# Patient Record
Sex: Male | Born: 1975 | Race: White | Hispanic: No | Smoking: Current every day smoker
Health system: Southern US, Community
[De-identification: ages and names within clinical notes are randomized; demographics above are authoritative.]

## PROBLEM LIST (undated history)

## (undated) DIAGNOSIS — K219 Gastro-esophageal reflux disease without esophagitis: Secondary | ICD-10-CM

## (undated) DIAGNOSIS — F209 Schizophrenia, unspecified: Secondary | ICD-10-CM

---

## 2011-12-12 LAB — CBC WITH DIFFERENTIAL/PLATELET
Basophil %: 0.5 %
Eosinophil %: 1.7 %
HCT: 47.1 % (ref 40.0–52.0)
HGB: 15.6 g/dL (ref 13.0–18.0)
Lymphocyte #: 1.6 10*3/uL (ref 1.0–3.6)
Lymphocyte %: 15.8 %
MCH: 30.1 pg (ref 26.0–34.0)
Monocyte #: 0.5 10*3/uL (ref 0.0–0.7)
Monocyte %: 5 %
Neutrophil #: 7.9 10*3/uL — ABNORMAL HIGH (ref 1.4–6.5)
Platelet: 184 10*3/uL (ref 150–440)
WBC: 10.3 10*3/uL (ref 3.8–10.6)

## 2012-01-08 LAB — CBC WITH DIFFERENTIAL/PLATELET
Basophil %: 0.7 %
Eosinophil %: 0.8 %
HGB: 14.6 g/dL (ref 13.0–18.0)
MCV: 90 fL (ref 80–100)
Monocyte %: 10.3 %
Neutrophil #: 8.3 10*3/uL — ABNORMAL HIGH (ref 1.4–6.5)
Neutrophil %: 75.2 %
Platelet: 197 10*3/uL (ref 150–440)
RBC: 4.99 10*6/uL (ref 4.40–5.90)
RDW: 12 % (ref 11.5–14.5)
WBC: 11 10*3/uL — ABNORMAL HIGH (ref 3.8–10.6)

## 2012-02-10 LAB — CBC WITH DIFFERENTIAL/PLATELET
Basophil #: 0 10*3/uL (ref 0.0–0.1)
Basophil %: 0.4 %
Eosinophil #: 0.2 10*3/uL (ref 0.0–0.7)
Eosinophil %: 1.4 %
HCT: 43.8 % (ref 40.0–52.0)
HGB: 14.3 g/dL (ref 13.0–18.0)
Lymphocyte #: 2.1 10*3/uL (ref 1.0–3.6)
Lymphocyte %: 18.3 %
MCHC: 32.7 g/dL (ref 32.0–36.0)
MCV: 89 fL (ref 80–100)
Monocyte %: 4.5 %
Neutrophil #: 8.8 10*3/uL — ABNORMAL HIGH (ref 1.4–6.5)
Platelet: 191 10*3/uL (ref 150–440)
RDW: 13 % (ref 11.5–14.5)
WBC: 11.6 10*3/uL — ABNORMAL HIGH (ref 3.8–10.6)

## 2012-03-10 LAB — CBC WITH DIFFERENTIAL/PLATELET
Basophil #: 0.1 10*3/uL (ref 0.0–0.1)
Basophil %: 0.8 %
Eosinophil #: 0.3 10*3/uL (ref 0.0–0.7)
Eosinophil %: 3.3 %
HGB: 15.4 g/dL (ref 13.0–18.0)
Lymphocyte #: 1.9 10*3/uL (ref 1.0–3.6)
MCV: 91 fL (ref 80–100)
Monocyte #: 0.5 x10 3/mm (ref 0.2–1.0)
Monocyte %: 7 %
Neutrophil %: 64.3 %
Platelet: 169 10*3/uL (ref 150–440)
RBC: 5.17 10*6/uL (ref 4.40–5.90)
RDW: 14 % (ref 11.5–14.5)

## 2012-04-13 LAB — CBC WITH DIFFERENTIAL/PLATELET
Basophil %: 0.5 %
HCT: 44.1 % (ref 40.0–52.0)
HGB: 14.8 g/dL (ref 13.0–18.0)
MCH: 30.1 pg (ref 26.0–34.0)
MCHC: 33.5 g/dL (ref 32.0–36.0)
MCV: 90 fL (ref 80–100)
Neutrophil %: 66.6 %
Platelet: 184 10*3/uL (ref 150–440)
RBC: 4.91 10*6/uL (ref 4.40–5.90)
RDW: 14.1 % (ref 11.5–14.5)

## 2012-04-29 LAB — CBC WITH DIFFERENTIAL/PLATELET
Basophil %: 0.8 %
Eosinophil #: 0.2 10*3/uL (ref 0.0–0.7)
Eosinophil %: 2 %
HCT: 44.3 % (ref 40.0–52.0)
HGB: 14.7 g/dL (ref 13.0–18.0)
MCHC: 33.2 g/dL (ref 32.0–36.0)
MCV: 90 fL (ref 80–100)
Monocyte %: 9 %
Neutrophil #: 4.4 10*3/uL (ref 1.4–6.5)
Neutrophil %: 59.4 %
Platelet: 167 10*3/uL (ref 150–440)
RDW: 13.3 % (ref 11.5–14.5)

## 2012-06-02 LAB — CBC WITH DIFFERENTIAL/PLATELET
Basophil #: 0.1 10*3/uL (ref 0.0–0.1)
Eosinophil #: 0.1 10*3/uL (ref 0.0–0.7)
Eosinophil %: 2 %
Lymphocyte #: 2.2 10*3/uL (ref 1.0–3.6)
MCHC: 33 g/dL (ref 32.0–36.0)
MCV: 90 fL (ref 80–100)
Monocyte %: 10.8 %
Neutrophil %: 56 %
Platelet: 239 10*3/uL (ref 150–440)
RBC: 5.09 10*6/uL (ref 4.40–5.90)
RDW: 13.4 % (ref 11.5–14.5)
WBC: 7.2 10*3/uL (ref 3.8–10.6)

## 2012-06-29 LAB — CBC WITH DIFFERENTIAL/PLATELET
Basophil #: 0.1 10*3/uL (ref 0.0–0.1)
Basophil %: 0.9 %
Eosinophil #: 0.2 10*3/uL (ref 0.0–0.7)
Eosinophil %: 2.3 %
HCT: 46.4 % (ref 40.0–52.0)
HGB: 15.5 g/dL (ref 13.0–18.0)
Lymphocyte %: 24.3 %
Monocyte %: 6.9 %
Neutrophil #: 6.5 10*3/uL (ref 1.4–6.5)
Neutrophil %: 65.6 %
RDW: 13.8 % (ref 11.5–14.5)
WBC: 9.9 10*3/uL (ref 3.8–10.6)

## 2012-07-28 LAB — CBC WITH DIFFERENTIAL/PLATELET
Basophil %: 0.8 %
Eosinophil %: 2.4 %
HCT: 45 % (ref 40.0–52.0)
HGB: 15 g/dL (ref 13.0–18.0)
Lymphocyte #: 2.1 10*3/uL (ref 1.0–3.6)
MCH: 30.1 pg (ref 26.0–34.0)
MCV: 91 fL (ref 80–100)
Monocyte #: 0.6 x10 3/mm (ref 0.2–1.0)
Monocyte %: 8.3 %
Neutrophil #: 4.3 10*3/uL (ref 1.4–6.5)
Neutrophil %: 59.7 %
Platelet: 181 10*3/uL (ref 150–440)
RBC: 4.96 10*6/uL (ref 4.40–5.90)
WBC: 7.2 10*3/uL (ref 3.8–10.6)

## 2012-08-19 LAB — CBC WITH DIFFERENTIAL/PLATELET
Basophil #: 0.1 10*3/uL (ref 0.0–0.1)
Eosinophil %: 2.2 %
HCT: 44.4 % (ref 40.0–52.0)
HGB: 14.8 g/dL (ref 13.0–18.0)
Lymphocyte %: 24.8 %
MCH: 30.2 pg (ref 26.0–34.0)
MCV: 91 fL (ref 80–100)
Monocyte #: 0.6 x10 3/mm (ref 0.2–1.0)
Monocyte %: 6.5 %
Neutrophil #: 5.9 10*3/uL (ref 1.4–6.5)
Platelet: 219 10*3/uL (ref 150–440)
RBC: 4.91 10*6/uL (ref 4.40–5.90)
RDW: 13.8 % (ref 11.5–14.5)
WBC: 9 10*3/uL (ref 3.8–10.6)

## 2012-09-20 LAB — CBC WITH DIFFERENTIAL/PLATELET
Basophil #: 0.1 10*3/uL (ref 0.0–0.1)
Basophil %: 0.9 %
Eosinophil #: 0.1 10*3/uL (ref 0.0–0.7)
HCT: 44.8 % (ref 40.0–52.0)
HGB: 15.1 g/dL (ref 13.0–18.0)
Lymphocyte %: 28.9 %
MCHC: 33.7 g/dL (ref 32.0–36.0)
Monocyte #: 0.6 x10 3/mm (ref 0.2–1.0)
Monocyte %: 7.4 %
Neutrophil %: 61.1 %
Platelet: 178 10*3/uL (ref 150–440)
RDW: 13.3 % (ref 11.5–14.5)

## 2012-10-26 LAB — CBC WITH DIFFERENTIAL/PLATELET
Basophil #: 0.1 10*3/uL (ref 0.0–0.1)
Eosinophil #: 0.1 10*3/uL (ref 0.0–0.7)
HCT: 43.4 % (ref 40.0–52.0)
Lymphocyte #: 1.7 10*3/uL (ref 1.0–3.6)
MCH: 29.3 pg (ref 26.0–34.0)
MCHC: 33.5 g/dL (ref 32.0–36.0)
MCV: 88 fL (ref 80–100)
Monocyte #: 0.7 x10 3/mm (ref 0.2–1.0)
Monocyte %: 8.2 %
Neutrophil #: 5.5 10*3/uL (ref 1.4–6.5)
Neutrophil %: 67.9 %
Platelet: 218 10*3/uL (ref 150–440)
RDW: 12.9 % (ref 11.5–14.5)
WBC: 8.1 10*3/uL (ref 3.8–10.6)

## 2012-11-17 LAB — CBC WITH DIFFERENTIAL/PLATELET
Basophil %: 0.7 %
HGB: 14.8 g/dL (ref 13.0–18.0)
Lymphocyte %: 21.5 %
MCH: 28.6 pg (ref 26.0–34.0)
MCHC: 32.6 g/dL (ref 32.0–36.0)
Neutrophil %: 69.3 %
RBC: 5.17 10*6/uL (ref 4.40–5.90)
RDW: 13.4 % (ref 11.5–14.5)
WBC: 8.5 10*3/uL (ref 3.8–10.6)

## 2012-12-16 LAB — CBC WITH DIFFERENTIAL/PLATELET
Basophil #: 0.1 10*3/uL (ref 0.0–0.1)
Basophil %: 0.7 %
Eosinophil #: 0.2 10*3/uL (ref 0.0–0.7)
HGB: 15 g/dL (ref 13.0–18.0)
Lymphocyte #: 2.1 10*3/uL (ref 1.0–3.6)
Lymphocyte %: 25 %
MCH: 29.2 pg (ref 26.0–34.0)
MCHC: 33.4 g/dL (ref 32.0–36.0)
MCV: 88 fL (ref 80–100)
Monocyte %: 7.9 %
Neutrophil %: 64.2 %
Platelet: 177 10*3/uL (ref 150–440)

## 2013-01-12 LAB — CBC WITH DIFFERENTIAL/PLATELET
Basophil #: 0.1 10*3/uL (ref 0.0–0.1)
Eosinophil #: 0.1 10*3/uL (ref 0.0–0.7)
Eosinophil %: 1.6 %
HGB: 15.4 g/dL (ref 13.0–18.0)
MCH: 29.6 pg (ref 26.0–34.0)
MCV: 88 fL (ref 80–100)
Monocyte #: 0.6 x10 3/mm (ref 0.2–1.0)
Neutrophil #: 6.3 10*3/uL (ref 1.4–6.5)
Neutrophil %: 69.8 %
RDW: 13.6 % (ref 11.5–14.5)

## 2013-02-15 LAB — CBC WITH DIFFERENTIAL/PLATELET
Basophil %: 0.7 %
Eosinophil %: 2.1 %
HCT: 46.2 % (ref 40.0–52.0)
HGB: 15.2 g/dL (ref 13.0–18.0)
Lymphocyte #: 1.9 10*3/uL (ref 1.0–3.6)
MCH: 29.1 pg (ref 26.0–34.0)
MCV: 88 fL (ref 80–100)
Monocyte #: 0.6 x10 3/mm (ref 0.2–1.0)
Monocyte %: 6.5 %
Neutrophil %: 71.3 %
Platelet: 174 10*3/uL (ref 150–440)
RDW: 13.4 % (ref 11.5–14.5)
WBC: 9.6 10*3/uL (ref 3.8–10.6)

## 2013-03-16 LAB — CBC WITH DIFFERENTIAL/PLATELET
Basophil %: 0.9 %
HGB: 15.3 g/dL (ref 13.0–18.0)
Lymphocyte #: 2 10*3/uL (ref 1.0–3.6)
MCH: 29 pg (ref 26.0–34.0)
MCHC: 32.8 g/dL (ref 32.0–36.0)
MCV: 88 fL (ref 80–100)
Monocyte #: 0.7 x10 3/mm (ref 0.2–1.0)
Monocyte %: 7.3 %
Platelet: 172 10*3/uL (ref 150–440)
RDW: 13.3 % (ref 11.5–14.5)
WBC: 9.2 10*3/uL (ref 3.8–10.6)

## 2013-04-13 LAB — CBC WITH DIFFERENTIAL/PLATELET
Basophil %: 0.6 %
HCT: 43.3 % (ref 40.0–52.0)
Lymphocyte #: 1.5 10*3/uL (ref 1.0–3.6)
MCHC: 33.3 g/dL (ref 32.0–36.0)
Monocyte #: 0.7 x10 3/mm (ref 0.2–1.0)
Neutrophil #: 6.8 10*3/uL — ABNORMAL HIGH (ref 1.4–6.5)
Neutrophil %: 73.3 %
Platelet: 249 10*3/uL (ref 150–440)
RBC: 5.01 10*6/uL (ref 4.40–5.90)
WBC: 9.2 10*3/uL (ref 3.8–10.6)

## 2013-05-17 LAB — CBC WITH DIFFERENTIAL/PLATELET
Basophil #: 0.1 10*3/uL (ref 0.0–0.1)
Eosinophil #: 0.2 10*3/uL (ref 0.0–0.7)
Eosinophil %: 2.5 %
Lymphocyte %: 23.9 %
MCH: 29.5 pg (ref 26.0–34.0)
MCV: 87 fL (ref 80–100)
Monocyte %: 9.1 %
Neutrophil #: 4.4 10*3/uL (ref 1.4–6.5)
Platelet: 183 10*3/uL (ref 150–440)
RDW: 13.1 % (ref 11.5–14.5)

## 2013-05-25 LAB — CBC WITH DIFFERENTIAL/PLATELET
Basophil #: 0.1 10*3/uL (ref 0.0–0.1)
Basophil %: 0.7 %
Eosinophil #: 0.3 10*3/uL (ref 0.0–0.7)
Eosinophil %: 2.4 %
HGB: 16.4 g/dL (ref 13.0–18.0)
Lymphocyte #: 2 10*3/uL (ref 1.0–3.6)
MCH: 29.7 pg (ref 26.0–34.0)
MCHC: 33.8 g/dL (ref 32.0–36.0)
MCV: 88 fL (ref 80–100)
Monocyte #: 0.7 x10 3/mm (ref 0.2–1.0)
Monocyte %: 6.5 %
Neutrophil %: 71.4 %
WBC: 10.6 10*3/uL (ref 3.8–10.6)

## 2013-06-15 LAB — CBC WITH DIFFERENTIAL/PLATELET
Basophil #: 0.1 10*3/uL (ref 0.0–0.1)
Basophil %: 0.5 %
Eosinophil #: 0.2 10*3/uL (ref 0.0–0.7)
HCT: 46 % (ref 40.0–52.0)
MCH: 29.8 pg (ref 26.0–34.0)
Monocyte #: 0.7 x10 3/mm (ref 0.2–1.0)
Monocyte %: 6.3 %
Neutrophil #: 7.9 10*3/uL — ABNORMAL HIGH (ref 1.4–6.5)
Platelet: 188 10*3/uL (ref 150–440)
RDW: 13.4 % (ref 11.5–14.5)

## 2013-07-19 LAB — CBC WITH DIFFERENTIAL/PLATELET
Basophil #: 0.1 10*3/uL (ref 0.0–0.1)
Eosinophil #: 0.1 10*3/uL (ref 0.0–0.7)
Eosinophil %: 1.7 %
HCT: 48.3 % (ref 40.0–52.0)
Lymphocyte #: 1.4 10*3/uL (ref 1.0–3.6)
Lymphocyte %: 16.3 %
MCH: 28.8 pg (ref 26.0–34.0)
MCHC: 32.4 g/dL (ref 32.0–36.0)
Monocyte #: 0.7 x10 3/mm (ref 0.2–1.0)
Monocyte %: 7.7 %
Neutrophil #: 6.4 10*3/uL (ref 1.4–6.5)
Neutrophil %: 73.7 %
Platelet: 173 10*3/uL (ref 150–440)
RBC: 5.44 10*6/uL (ref 4.40–5.90)
WBC: 8.7 10*3/uL (ref 3.8–10.6)

## 2013-07-26 LAB — HEPATIC FUNCTION PANEL A (ARMC)
Albumin: 4.1 g/dL (ref 3.4–5.0)
Bilirubin, Direct: 0.1 mg/dL (ref 0.00–0.20)
Bilirubin,Total: 0.5 mg/dL (ref 0.2–1.0)
SGOT(AST): 16 U/L (ref 15–37)

## 2013-07-26 LAB — LIPID PANEL
HDL Cholesterol: 80 mg/dL — ABNORMAL HIGH (ref 40–60)
Ldl Cholesterol, Calc: 49 mg/dL (ref 0–100)
Triglycerides: 98 mg/dL (ref 0–200)
VLDL Cholesterol, Calc: 20 mg/dL (ref 5–40)

## 2013-08-17 LAB — CBC WITH DIFFERENTIAL/PLATELET
Basophil #: 0.1 10*3/uL (ref 0.0–0.1)
HCT: 44 % (ref 40.0–52.0)
HGB: 14.5 g/dL (ref 13.0–18.0)
Lymphocyte %: 14.7 %
MCV: 88 fL (ref 80–100)
Neutrophil #: 8.1 10*3/uL — ABNORMAL HIGH (ref 1.4–6.5)
Neutrophil %: 76.2 %
RBC: 4.98 10*6/uL (ref 4.40–5.90)
RDW: 13.2 % (ref 11.5–14.5)

## 2013-09-21 LAB — CBC WITH DIFFERENTIAL/PLATELET
Basophil %: 0.6 %
Eosinophil #: 0.3 10*3/uL (ref 0.0–0.7)
Eosinophil %: 2.8 %
HCT: 42.8 % (ref 40.0–52.0)
Lymphocyte #: 2.1 10*3/uL (ref 1.0–3.6)
MCH: 29.1 pg (ref 26.0–34.0)
Monocyte #: 0.6 x10 3/mm (ref 0.2–1.0)
Monocyte %: 6.8 %
Neutrophil #: 6.2 10*3/uL (ref 1.4–6.5)
Neutrophil %: 67.3 %
RBC: 4.83 10*6/uL (ref 4.40–5.90)

## 2013-10-11 LAB — CBC WITH DIFFERENTIAL/PLATELET
Eosinophil #: 0.2 10*3/uL (ref 0.0–0.7)
Eosinophil %: 2.4 %
HGB: 13.1 g/dL (ref 13.0–18.0)
Lymphocyte #: 1.6 10*3/uL (ref 1.0–3.6)
Lymphocyte %: 22.7 %
MCHC: 33.3 g/dL (ref 32.0–36.0)
MCV: 89 fL (ref 80–100)
Monocyte %: 8.2 %
Neutrophil %: 65.9 %
Platelet: 195 10*3/uL (ref 150–440)
RDW: 12.9 % (ref 11.5–14.5)

## 2013-11-15 LAB — CBC WITH DIFFERENTIAL/PLATELET
BASOS ABS: 0.1 10*3/uL (ref 0.0–0.1)
BASOS PCT: 0.7 %
EOS ABS: 0.2 10*3/uL (ref 0.0–0.7)
Eosinophil %: 1.8 %
HCT: 41.5 % (ref 40.0–52.0)
HGB: 13.7 g/dL (ref 13.0–18.0)
Lymphocyte #: 1.5 10*3/uL (ref 1.0–3.6)
Lymphocyte %: 17.8 %
MCH: 29.4 pg (ref 26.0–34.0)
MCHC: 33.1 g/dL (ref 32.0–36.0)
MCV: 89 fL (ref 80–100)
Monocyte #: 0.7 x10 3/mm (ref 0.2–1.0)
Monocyte %: 8 %
Neutrophil #: 6.1 10*3/uL (ref 1.4–6.5)
Neutrophil %: 71.7 %
PLATELETS: 202 10*3/uL (ref 150–440)
RBC: 4.67 10*6/uL (ref 4.40–5.90)
RDW: 13 % (ref 11.5–14.5)
WBC: 8.5 10*3/uL (ref 3.8–10.6)

## 2013-12-13 LAB — CBC WITH DIFFERENTIAL/PLATELET
Basophil #: 0 10*3/uL (ref 0.0–0.1)
Basophil %: 0.5 %
Eosinophil #: 0.2 10*3/uL (ref 0.0–0.7)
Eosinophil %: 2.1 %
HCT: 41.4 % (ref 40.0–52.0)
HGB: 13.7 g/dL (ref 13.0–18.0)
LYMPHS ABS: 1.6 10*3/uL (ref 1.0–3.6)
Lymphocyte %: 19.8 %
MCH: 29.2 pg (ref 26.0–34.0)
MCHC: 33.1 g/dL (ref 32.0–36.0)
MCV: 88 fL (ref 80–100)
MONOS PCT: 8.7 %
Monocyte #: 0.7 x10 3/mm (ref 0.2–1.0)
Neutrophil #: 5.7 10*3/uL (ref 1.4–6.5)
Neutrophil %: 68.9 %
Platelet: 174 10*3/uL (ref 150–440)
RBC: 4.68 10*6/uL (ref 4.40–5.90)
RDW: 12.9 % (ref 11.5–14.5)
WBC: 8.3 10*3/uL (ref 3.8–10.6)

## 2014-01-15 ENCOUNTER — Observation Stay: Payer: Self-pay | Admitting: Surgery

## 2014-01-15 LAB — CBC WITH DIFFERENTIAL/PLATELET
BASOS PCT: 0.4 %
Basophil #: 0 10*3/uL (ref 0.0–0.1)
Eosinophil #: 0.1 10*3/uL (ref 0.0–0.7)
Eosinophil %: 1 %
HCT: 42.7 % (ref 40.0–52.0)
HGB: 14 g/dL (ref 13.0–18.0)
Lymphocyte #: 1.7 10*3/uL (ref 1.0–3.6)
Lymphocyte %: 16.9 %
MCH: 28.6 pg (ref 26.0–34.0)
MCHC: 32.7 g/dL (ref 32.0–36.0)
MCV: 87 fL (ref 80–100)
MONO ABS: 1 x10 3/mm (ref 0.2–1.0)
Monocyte %: 10.1 %
Neutrophil #: 7 10*3/uL — ABNORMAL HIGH (ref 1.4–6.5)
Neutrophil %: 71.6 %
Platelet: 180 10*3/uL (ref 150–440)
RBC: 4.88 10*6/uL (ref 4.40–5.90)
RDW: 12.8 % (ref 11.5–14.5)
WBC: 9.8 10*3/uL (ref 3.8–10.6)

## 2014-01-15 LAB — URINALYSIS, COMPLETE
BACTERIA: NONE SEEN
Bilirubin,UR: NEGATIVE
Blood: NEGATIVE
GLUCOSE, UR: NEGATIVE mg/dL (ref 0–75)
Ketone: NEGATIVE
LEUKOCYTE ESTERASE: NEGATIVE
Nitrite: NEGATIVE
PH: 7 (ref 4.5–8.0)
Protein: NEGATIVE
Specific Gravity: 1.027 (ref 1.003–1.030)

## 2014-01-15 LAB — COMPREHENSIVE METABOLIC PANEL
ALBUMIN: 4 g/dL (ref 3.4–5.0)
Alkaline Phosphatase: 157 U/L — ABNORMAL HIGH
Anion Gap: 5 — ABNORMAL LOW (ref 7–16)
BILIRUBIN TOTAL: 0.3 mg/dL (ref 0.2–1.0)
BUN: 10 mg/dL (ref 7–18)
CO2: 30 mmol/L (ref 21–32)
Calcium, Total: 8.5 mg/dL (ref 8.5–10.1)
Chloride: 105 mmol/L (ref 98–107)
Creatinine: 1.11 mg/dL (ref 0.60–1.30)
EGFR (Non-African Amer.): >60
Glucose: 112 mg/dL — ABNORMAL HIGH (ref 65–99)
Osmolality: 279 (ref 275–301)
POTASSIUM: 3.7 mmol/L (ref 3.5–5.1)
SGOT(AST): 31 U/L (ref 15–37)
SGPT (ALT): 35 U/L (ref 12–78)
Sodium: 140 mmol/L (ref 136–145)
Total Protein: 7.4 g/dL (ref 6.4–8.2)

## 2014-01-15 LAB — CBC
HCT: 45.9 % (ref 40.0–52.0)
HGB: 14.6 g/dL (ref 13.0–18.0)
MCH: 27.8 pg (ref 26.0–34.0)
MCHC: 31.7 g/dL — AB (ref 32.0–36.0)
MCV: 88 fL (ref 80–100)
Platelet: 187 10*3/uL (ref 150–440)
RBC: 5.25 10*6/uL (ref 4.40–5.90)
RDW: 12.8 % (ref 11.5–14.5)
WBC: 16.4 10*3/uL — ABNORMAL HIGH (ref 3.8–10.6)

## 2014-01-17 LAB — CBC WITH DIFFERENTIAL/PLATELET
BASOS ABS: 0.1 10*3/uL (ref 0.0–0.1)
Basophil %: 0.6 %
Eosinophil #: 0.2 10*3/uL (ref 0.0–0.7)
Eosinophil %: 2.1 %
HCT: 44.8 % (ref 40.0–52.0)
HGB: 14.4 g/dL (ref 13.0–18.0)
Lymphocyte #: 1.5 10*3/uL (ref 1.0–3.6)
Lymphocyte %: 16.9 %
MCH: 28.2 pg (ref 26.0–34.0)
MCHC: 32.1 g/dL (ref 32.0–36.0)
MCV: 88 fL (ref 80–100)
Monocyte #: 0.9 x10 3/mm (ref 0.2–1.0)
Monocyte %: 10 %
NEUTROS ABS: 6.1 10*3/uL (ref 1.4–6.5)
NEUTROS PCT: 70.4 %
PLATELETS: 183 10*3/uL (ref 150–440)
RBC: 5.1 10*6/uL (ref 4.40–5.90)
RDW: 12.6 % (ref 11.5–14.5)
WBC: 8.6 10*3/uL (ref 3.8–10.6)

## 2014-02-14 LAB — CBC WITH DIFFERENTIAL/PLATELET
BASOS ABS: 0.1 10*3/uL (ref 0.0–0.1)
BASOS PCT: 0.7 %
Eosinophil #: 0.2 10*3/uL (ref 0.0–0.7)
Eosinophil %: 2 %
HCT: 42.9 % (ref 40.0–52.0)
HGB: 14.3 g/dL (ref 13.0–18.0)
LYMPHS ABS: 1.7 10*3/uL (ref 1.0–3.6)
LYMPHS PCT: 22.1 %
MCH: 28.9 pg (ref 26.0–34.0)
MCHC: 33.3 g/dL (ref 32.0–36.0)
MCV: 87 fL (ref 80–100)
MONO ABS: 0.7 x10 3/mm (ref 0.2–1.0)
Monocyte %: 8.4 %
NEUTROS ABS: 5.2 10*3/uL (ref 1.4–6.5)
Neutrophil %: 66.8 %
PLATELETS: 188 10*3/uL (ref 150–440)
RBC: 4.93 10*6/uL (ref 4.40–5.90)
RDW: 12.6 % (ref 11.5–14.5)
WBC: 7.8 10*3/uL (ref 3.8–10.6)

## 2014-03-01 ENCOUNTER — Ambulatory Visit: Payer: Self-pay | Admitting: Dermatology

## 2014-03-14 LAB — CBC WITH DIFFERENTIAL/PLATELET
Basophil #: 0 10*3/uL (ref 0.0–0.1)
Basophil %: 0.4 %
EOS PCT: 1.7 %
Eosinophil #: 0.2 10*3/uL (ref 0.0–0.7)
HCT: 43.7 % (ref 40.0–52.0)
HGB: 14.4 g/dL (ref 13.0–18.0)
Lymphocyte #: 1.5 10*3/uL (ref 1.0–3.6)
Lymphocyte %: 14.8 %
MCH: 28.8 pg (ref 26.0–34.0)
MCHC: 32.9 g/dL (ref 32.0–36.0)
MCV: 88 fL (ref 80–100)
MONOS PCT: 6.9 %
Monocyte #: 0.7 x10 3/mm (ref 0.2–1.0)
NEUTROS PCT: 76.2 %
Neutrophil #: 7.9 10*3/uL — ABNORMAL HIGH (ref 1.4–6.5)
Platelet: 217 10*3/uL (ref 150–440)
RBC: 4.99 10*6/uL (ref 4.40–5.90)
RDW: 12.7 % (ref 11.5–14.5)
WBC: 10.4 10*3/uL (ref 3.8–10.6)

## 2014-04-12 LAB — CBC WITH DIFFERENTIAL/PLATELET
BASOS ABS: 0.1 10*3/uL (ref 0.0–0.1)
Basophil %: 0.6 %
EOS ABS: 0.2 10*3/uL (ref 0.0–0.7)
EOS PCT: 2 %
HCT: 43.6 % (ref 40.0–52.0)
HGB: 14.1 g/dL (ref 13.0–18.0)
LYMPHS ABS: 1.7 10*3/uL (ref 1.0–3.6)
Lymphocyte %: 21 %
MCH: 28.7 pg (ref 26.0–34.0)
MCHC: 32.3 g/dL (ref 32.0–36.0)
MCV: 89 fL (ref 80–100)
MONO ABS: 0.6 x10 3/mm (ref 0.2–1.0)
Monocyte %: 7.4 %
NEUTROS PCT: 69 %
Neutrophil #: 5.7 10*3/uL (ref 1.4–6.5)
Platelet: 176 10*3/uL (ref 150–440)
RBC: 4.92 10*6/uL (ref 4.40–5.90)
RDW: 12.6 % (ref 11.5–14.5)
WBC: 8.3 10*3/uL (ref 3.8–10.6)

## 2014-05-18 LAB — CBC WITH DIFFERENTIAL/PLATELET
BASOS PCT: 0.6 %
Basophil #: 0.1 10*3/uL (ref 0.0–0.1)
Eosinophil #: 0.2 10*3/uL (ref 0.0–0.7)
Eosinophil %: 1.6 %
HCT: 42.2 % (ref 40.0–52.0)
HGB: 14 g/dL (ref 13.0–18.0)
LYMPHS PCT: 16.7 %
Lymphocyte #: 1.6 10*3/uL (ref 1.0–3.6)
MCH: 29.2 pg (ref 26.0–34.0)
MCHC: 33.1 g/dL (ref 32.0–36.0)
MCV: 88 fL (ref 80–100)
MONO ABS: 0.7 x10 3/mm (ref 0.2–1.0)
MONOS PCT: 6.6 %
Neutrophil #: 7.3 10*3/uL — ABNORMAL HIGH (ref 1.4–6.5)
Neutrophil %: 74.5 %
Platelet: 201 10*3/uL (ref 150–440)
RBC: 4.78 10*6/uL (ref 4.40–5.90)
RDW: 12.8 % (ref 11.5–14.5)
WBC: 9.8 10*3/uL (ref 3.8–10.6)

## 2014-06-13 LAB — CBC WITH DIFFERENTIAL/PLATELET
BASOS PCT: 0.7 %
Basophil #: 0.1 10*3/uL (ref 0.0–0.1)
EOS PCT: 2.2 %
Eosinophil #: 0.2 10*3/uL (ref 0.0–0.7)
HCT: 41.6 % (ref 40.0–52.0)
HGB: 13.6 g/dL (ref 13.0–18.0)
Lymphocyte #: 1.5 10*3/uL (ref 1.0–3.6)
Lymphocyte %: 19.9 %
MCH: 28.9 pg (ref 26.0–34.0)
MCHC: 32.8 g/dL (ref 32.0–36.0)
MCV: 88 fL (ref 80–100)
MONO ABS: 0.6 x10 3/mm (ref 0.2–1.0)
MONOS PCT: 7.7 %
NEUTROS ABS: 5.4 10*3/uL (ref 1.4–6.5)
Neutrophil %: 69.5 %
Platelet: 182 10*3/uL (ref 150–440)
RBC: 4.71 10*6/uL (ref 4.40–5.90)
RDW: 13.2 % (ref 11.5–14.5)
WBC: 7.8 10*3/uL (ref 3.8–10.6)

## 2014-07-11 LAB — CBC WITH DIFFERENTIAL/PLATELET
BASOS ABS: 0.1 10*3/uL (ref 0.0–0.1)
Basophil %: 0.8 %
EOS ABS: 0.1 10*3/uL (ref 0.0–0.7)
EOS PCT: 1.5 %
HCT: 42.4 % (ref 40.0–52.0)
HGB: 13.9 g/dL (ref 13.0–18.0)
LYMPHS PCT: 21.6 %
Lymphocyte #: 1.6 10*3/uL (ref 1.0–3.6)
MCH: 28.9 pg (ref 26.0–34.0)
MCHC: 32.7 g/dL (ref 32.0–36.0)
MCV: 88 fL (ref 80–100)
MONOS PCT: 8.7 %
Monocyte #: 0.6 x10 3/mm (ref 0.2–1.0)
Neutrophil #: 5 10*3/uL (ref 1.4–6.5)
Neutrophil %: 67.4 %
Platelet: 176 10*3/uL (ref 150–440)
RBC: 4.81 10*6/uL (ref 4.40–5.90)
RDW: 13.1 % (ref 11.5–14.5)
WBC: 7.3 10*3/uL (ref 3.8–10.6)

## 2014-07-12 LAB — HEMOGLOBIN A1C: Hemoglobin A1C: 5.9 % (ref 4.2–6.3)

## 2014-07-12 LAB — LIPID PANEL
Cholesterol: 161 mg/dL (ref 0–200)
HDL: 62 mg/dL — AB (ref 40–60)
Ldl Cholesterol, Calc: 61 mg/dL (ref 0–100)
Triglycerides: 191 mg/dL (ref 0–200)
VLDL Cholesterol, Calc: 38 mg/dL (ref 5–40)

## 2014-08-22 LAB — CBC WITH DIFFERENTIAL/PLATELET
BASOS PCT: 0.8 %
Basophil #: 0.1 10*3/uL (ref 0.0–0.1)
EOS PCT: 1.6 %
Eosinophil #: 0.1 10*3/uL (ref 0.0–0.7)
HCT: 42.1 % (ref 40.0–52.0)
HGB: 14 g/dL (ref 13.0–18.0)
Lymphocyte #: 1.7 10*3/uL (ref 1.0–3.6)
Lymphocyte %: 23.5 %
MCH: 29.5 pg (ref 26.0–34.0)
MCHC: 33.2 g/dL (ref 32.0–36.0)
MCV: 89 fL (ref 80–100)
MONO ABS: 0.7 x10 3/mm (ref 0.2–1.0)
Monocyte %: 8.9 %
NEUTROS PCT: 65.2 %
Neutrophil #: 4.8 10*3/uL (ref 1.4–6.5)
Platelet: 177 10*3/uL (ref 150–440)
RBC: 4.73 10*6/uL (ref 4.40–5.90)
RDW: 12.9 % (ref 11.5–14.5)
WBC: 7.3 10*3/uL (ref 3.8–10.6)

## 2014-09-13 LAB — CBC WITH DIFFERENTIAL/PLATELET
Basophil #: 0.1 10*3/uL (ref 0.0–0.1)
Basophil %: 0.9 %
Eosinophil #: 0.1 10*3/uL (ref 0.0–0.7)
Eosinophil %: 1.8 %
HCT: 44.4 % (ref 40.0–52.0)
HGB: 14.6 g/dL (ref 13.0–18.0)
Lymphocyte #: 1.7 10*3/uL (ref 1.0–3.6)
Lymphocyte %: 22.9 %
MCH: 29.2 pg (ref 26.0–34.0)
MCHC: 32.8 g/dL (ref 32.0–36.0)
MCV: 89 fL (ref 80–100)
Monocyte #: 0.6 x10 3/mm (ref 0.2–1.0)
Monocyte %: 8.4 %
Neutrophil #: 5 10*3/uL (ref 1.4–6.5)
Neutrophil %: 66 %
Platelet: 206 10*3/uL (ref 150–440)
RBC: 5 10*6/uL (ref 4.40–5.90)
RDW: 13.1 % (ref 11.5–14.5)
WBC: 7.5 10*3/uL (ref 3.8–10.6)

## 2014-10-23 LAB — CBC WITH DIFFERENTIAL/PLATELET
Basophil #: 0.1 10*3/uL (ref 0.0–0.1)
Basophil %: 0.7 %
Eosinophil #: 0.1 10*3/uL (ref 0.0–0.7)
Eosinophil %: 1.2 %
HCT: 46.7 % (ref 40.0–52.0)
HGB: 15.1 g/dL (ref 13.0–18.0)
LYMPHS PCT: 21.8 %
Lymphocyte #: 1.7 10*3/uL (ref 1.0–3.6)
MCH: 28.7 pg (ref 26.0–34.0)
MCHC: 32.3 g/dL (ref 32.0–36.0)
MCV: 89 fL (ref 80–100)
MONO ABS: 0.6 x10 3/mm (ref 0.2–1.0)
MONOS PCT: 7.8 %
Neutrophil #: 5.5 10*3/uL (ref 1.4–6.5)
Neutrophil %: 68.5 %
Platelet: 216 10*3/uL (ref 150–440)
RBC: 5.25 10*6/uL (ref 4.40–5.90)
RDW: 13.1 % (ref 11.5–14.5)
WBC: 8 10*3/uL (ref 3.8–10.6)

## 2014-11-14 LAB — CBC WITH DIFFERENTIAL/PLATELET
Basophil #: 0.1 10*3/uL (ref 0.0–0.1)
Basophil %: 0.8 %
Eosinophil #: 0.1 10*3/uL (ref 0.0–0.7)
Eosinophil %: 1 %
HCT: 44.5 % (ref 40.0–52.0)
HGB: 14.7 g/dL (ref 13.0–18.0)
LYMPHS ABS: 1.9 10*3/uL (ref 1.0–3.6)
Lymphocyte %: 19.3 %
MCH: 28.9 pg (ref 26.0–34.0)
MCHC: 33.1 g/dL (ref 32.0–36.0)
MCV: 87 fL (ref 80–100)
MONO ABS: 0.6 x10 3/mm (ref 0.2–1.0)
Monocyte %: 6.4 %
Neutrophil #: 7.1 10*3/uL — ABNORMAL HIGH (ref 1.4–6.5)
Neutrophil %: 72.5 %
PLATELETS: 241 10*3/uL (ref 150–440)
RBC: 5.09 10*6/uL (ref 4.40–5.90)
RDW: 13.1 % (ref 11.5–14.5)
WBC: 9.8 10*3/uL (ref 3.8–10.6)

## 2014-12-12 LAB — CBC WITH DIFFERENTIAL/PLATELET
Basophil #: 0.1 10*3/uL (ref 0.0–0.1)
Basophil %: 0.7 %
Eosinophil #: 0.1 10*3/uL (ref 0.0–0.7)
Eosinophil %: 1.1 %
HCT: 44.3 % (ref 40.0–52.0)
HGB: 14.6 g/dL (ref 13.0–18.0)
LYMPHS ABS: 1.6 10*3/uL (ref 1.0–3.6)
Lymphocyte %: 18.2 %
MCH: 28.6 pg (ref 26.0–34.0)
MCHC: 32.9 g/dL (ref 32.0–36.0)
MCV: 87 fL (ref 80–100)
MONO ABS: 0.7 x10 3/mm (ref 0.2–1.0)
Monocyte %: 7.9 %
NEUTROS PCT: 72.1 %
Neutrophil #: 6.5 10*3/uL (ref 1.4–6.5)
PLATELETS: 207 10*3/uL (ref 150–440)
RBC: 5.09 10*6/uL (ref 4.40–5.90)
RDW: 13.4 % (ref 11.5–14.5)
WBC: 9 10*3/uL (ref 3.8–10.6)

## 2015-01-09 ENCOUNTER — Encounter: Admit: 2015-01-09 | Disposition: A | Discharge status: Home / Self Care | Payer: Self-pay

## 2015-02-09 ENCOUNTER — Encounter: Admit: 2015-02-09 | Disposition: A | Discharge status: Home / Self Care | Payer: Self-pay

## 2015-02-13 LAB — CBC WITH DIFFERENTIAL/PLATELET
BASOS ABS: 0.1 10*3/uL (ref 0.0–0.1)
Basophil %: 1 %
Eosinophil #: 0.1 10*3/uL (ref 0.0–0.7)
Eosinophil %: 1.9 %
HCT: 44.5 % (ref 40.0–52.0)
HGB: 14.8 g/dL (ref 13.0–18.0)
LYMPHS ABS: 1.9 10*3/uL (ref 1.0–3.6)
Lymphocyte %: 24.4 %
MCH: 28.8 pg (ref 26.0–34.0)
MCHC: 33.3 g/dL (ref 32.0–36.0)
MCV: 87 fL (ref 80–100)
MONO ABS: 0.8 x10 3/mm (ref 0.2–1.0)
Monocyte %: 9.4 %
NEUTROS ABS: 5 10*3/uL (ref 1.4–6.5)
Neutrophil %: 63.3 %
Platelet: 221 10*3/uL (ref 150–440)
RBC: 5.14 10*6/uL (ref 4.40–5.90)
RDW: 13.3 % (ref 11.5–14.5)
WBC: 7.9 10*3/uL (ref 3.8–10.6)
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
## 2015-03-15 ENCOUNTER — Other Ambulatory Visit
Admission: RE | Admit: 2015-03-15 | Discharge: 2015-03-15 | Disposition: A | Discharge status: Home / Self Care | Payer: Medicare Other | Source: Ambulatory Visit | Attending: Psychiatry | Admitting: Psychiatry

## 2015-03-15 DIAGNOSIS — Z5181 Encounter for therapeutic drug level monitoring: Secondary | ICD-10-CM | POA: Diagnosis present

## 2015-03-15 LAB — CBC WITH DIFFERENTIAL/PLATELET
BASOS ABS: 0.1 10*3/uL (ref 0–0.1)
BASOS PCT: 1 %
EOS PCT: 2 %
Eosinophils Absolute: 0.2 10*3/uL (ref 0–0.7)
HCT: 46.1 % (ref 40.0–52.0)
Hemoglobin: 15.3 g/dL (ref 13.0–18.0)
Lymphocytes Relative: 24 %
Lymphs Abs: 1.9 10*3/uL (ref 1.0–3.6)
MCH: 28.8 pg (ref 26.0–34.0)
MCHC: 33.2 g/dL (ref 32.0–36.0)
MCV: 86.9 fL (ref 80.0–100.0)
Monocytes Absolute: 0.6 10*3/uL (ref 0.2–1.0)
Monocytes Relative: 8 %
Neutro Abs: 5.4 10*3/uL (ref 1.4–6.5)
Neutrophils Relative %: 65 %
PLATELETS: 196 10*3/uL (ref 150–440)
RBC: 5.31 MIL/uL (ref 4.40–5.90)
RDW: 13.3 % (ref 11.5–14.5)
WBC: 8.2 10*3/uL (ref 3.8–10.6)

## 2015-04-16 ENCOUNTER — Other Ambulatory Visit
Admission: RE | Admit: 2015-04-16 | Discharge: 2015-04-16 | Disposition: A | Discharge status: Home / Self Care | Payer: Medicare Other | Source: Ambulatory Visit | Attending: Psychiatry | Admitting: Psychiatry

## 2015-04-16 DIAGNOSIS — Z5181 Encounter for therapeutic drug level monitoring: Secondary | ICD-10-CM | POA: Diagnosis present

## 2015-04-16 LAB — CBC WITH DIFFERENTIAL/PLATELET
BASOS ABS: 0.1 10*3/uL (ref 0–0.1)
Basophils Relative: 1 %
EOS PCT: 1 %
Eosinophils Absolute: 0.2 10*3/uL (ref 0–0.7)
HCT: 43.9 % (ref 40.0–52.0)
HEMOGLOBIN: 14.8 g/dL (ref 13.0–18.0)
LYMPHS ABS: 2.1 10*3/uL (ref 1.0–3.6)
Lymphocytes Relative: 19 %
MCH: 29.3 pg (ref 26.0–34.0)
MCHC: 33.8 g/dL (ref 32.0–36.0)
MCV: 86.8 fL (ref 80.0–100.0)
MONO ABS: 0.8 10*3/uL (ref 0.2–1.0)
Monocytes Relative: 7 %
Neutro Abs: 7.8 10*3/uL — ABNORMAL HIGH (ref 1.4–6.5)
Neutrophils Relative %: 72 %
Platelets: 171 10*3/uL (ref 150–440)
RBC: 5.06 MIL/uL (ref 4.40–5.90)
RDW: 13.4 % (ref 11.5–14.5)
WBC: 10.8 10*3/uL — ABNORMAL HIGH (ref 3.8–10.6)
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
## 2015-05-16 ENCOUNTER — Other Ambulatory Visit
Admission: RE | Admit: 2015-05-16 | Discharge: 2015-05-16 | Disposition: A | Discharge status: Home / Self Care | Payer: Medicare Other | Source: Ambulatory Visit | Attending: Psychiatry | Admitting: Psychiatry

## 2015-05-16 DIAGNOSIS — Z5181 Encounter for therapeutic drug level monitoring: Secondary | ICD-10-CM | POA: Diagnosis present

## 2015-05-16 LAB — CBC WITH DIFFERENTIAL/PLATELET
BASOS PCT: 1 %
Basophils Absolute: 0.1 10*3/uL (ref 0–0.1)
EOS PCT: 2 %
Eosinophils Absolute: 0.1 10*3/uL (ref 0–0.7)
HEMATOCRIT: 43.4 % (ref 40.0–52.0)
Hemoglobin: 14.5 g/dL (ref 13.0–18.0)
LYMPHS PCT: 26 %
Lymphs Abs: 1.9 10*3/uL (ref 1.0–3.6)
MCH: 29.2 pg (ref 26.0–34.0)
MCHC: 33.3 g/dL (ref 32.0–36.0)
MCV: 87.6 fL (ref 80.0–100.0)
MONO ABS: 0.6 10*3/uL (ref 0.2–1.0)
Monocytes Relative: 8 %
Neutro Abs: 4.5 10*3/uL (ref 1.4–6.5)
Neutrophils Relative %: 63 %
Platelets: 178 10*3/uL (ref 150–440)
RBC: 4.96 MIL/uL (ref 4.40–5.90)
RDW: 13.5 % (ref 11.5–14.5)
WBC: 7.2 10*3/uL (ref 3.8–10.6)

## 2015-06-13 ENCOUNTER — Other Ambulatory Visit
Admission: RE | Admit: 2015-06-13 | Discharge: 2015-06-13 | Disposition: A | Discharge status: Home / Self Care | Payer: Medicare Other | Source: Ambulatory Visit | Attending: Psychiatry | Admitting: Psychiatry

## 2015-06-13 DIAGNOSIS — F209 Schizophrenia, unspecified: Secondary | ICD-10-CM | POA: Insufficient documentation

## 2015-06-13 DIAGNOSIS — Z79899 Other long term (current) drug therapy: Secondary | ICD-10-CM | POA: Diagnosis not present

## 2015-06-13 LAB — CBC WITH DIFFERENTIAL/PLATELET
Basophils Absolute: 0.1 10*3/uL (ref 0–0.1)
Basophils Relative: 1 %
EOS PCT: 2 %
Eosinophils Absolute: 0.2 10*3/uL (ref 0–0.7)
HCT: 43.9 % (ref 40.0–52.0)
Hemoglobin: 14.6 g/dL (ref 13.0–18.0)
LYMPHS ABS: 2.2 10*3/uL (ref 1.0–3.6)
Lymphocytes Relative: 22 %
MCH: 29.1 pg (ref 26.0–34.0)
MCHC: 33.3 g/dL (ref 32.0–36.0)
MCV: 87.3 fL (ref 80.0–100.0)
MONOS PCT: 6 %
Monocytes Absolute: 0.6 10*3/uL (ref 0.2–1.0)
Neutro Abs: 6.8 10*3/uL — ABNORMAL HIGH (ref 1.4–6.5)
Neutrophils Relative %: 69 %
Platelets: 171 10*3/uL (ref 150–440)
RBC: 5.03 MIL/uL (ref 4.40–5.90)
RDW: 13.1 % (ref 11.5–14.5)
WBC: 9.9 10*3/uL (ref 3.8–10.6)

## 2015-06-14 LAB — HEMOGLOBIN A1C: Hgb A1c MFr Bld: 5.2 % (ref 4.0–6.0)

## 2015-06-19 ENCOUNTER — Other Ambulatory Visit
Admission: RE | Admit: 2015-06-19 | Discharge: 2015-06-19 | Disposition: A | Discharge status: Home / Self Care | Payer: Medicare Other | Source: Ambulatory Visit | Attending: Psychiatry | Admitting: Psychiatry

## 2015-06-19 DIAGNOSIS — Z79899 Other long term (current) drug therapy: Secondary | ICD-10-CM | POA: Diagnosis present

## 2015-06-19 LAB — LIPID PANEL
CHOLESTEROL: 184 mg/dL (ref 0–200)
HDL: 60 mg/dL (ref >40)
LDL Cholesterol: 96 mg/dL (ref 0–99)
TRIGLYCERIDES: 141 mg/dL (ref <150)
Total CHOL/HDL Ratio: 3.1 RATIO
VLDL: 28 mg/dL (ref 0–40)

## 2015-07-17 ENCOUNTER — Other Ambulatory Visit
Admission: RE | Admit: 2015-07-17 | Discharge: 2015-07-17 | Disposition: A | Discharge status: Home / Self Care | Payer: Medicare Other | Source: Ambulatory Visit | Attending: Psychiatry | Admitting: Psychiatry

## 2015-07-17 DIAGNOSIS — F209 Schizophrenia, unspecified: Secondary | ICD-10-CM | POA: Diagnosis present

## 2015-07-17 LAB — CBC WITH DIFFERENTIAL/PLATELET
Basophils Absolute: 0.1 10*3/uL (ref 0–0.1)
Basophils Relative: 1 %
EOS ABS: 0.2 10*3/uL (ref 0–0.7)
EOS PCT: 2 %
HCT: 45.4 % (ref 40.0–52.0)
Hemoglobin: 15.3 g/dL (ref 13.0–18.0)
LYMPHS ABS: 2.2 10*3/uL (ref 1.0–3.6)
Lymphocytes Relative: 24 %
MCH: 29.5 pg (ref 26.0–34.0)
MCHC: 33.6 g/dL (ref 32.0–36.0)
MCV: 87.7 fL (ref 80.0–100.0)
MONO ABS: 0.7 10*3/uL (ref 0.2–1.0)
MONOS PCT: 8 %
Neutro Abs: 6.1 10*3/uL (ref 1.4–6.5)
Neutrophils Relative %: 65 %
PLATELETS: 187 10*3/uL (ref 150–440)
RBC: 5.18 MIL/uL (ref 4.40–5.90)
RDW: 13.3 % (ref 11.5–14.5)
WBC: 9.2 10*3/uL (ref 3.8–10.6)

## 2015-08-14 ENCOUNTER — Other Ambulatory Visit
Admission: RE | Admit: 2015-08-14 | Discharge: 2015-08-14 | Disposition: A | Discharge status: Home / Self Care | Payer: Medicare Other | Source: Ambulatory Visit | Attending: Psychiatry | Admitting: Psychiatry

## 2015-08-14 DIAGNOSIS — F209 Schizophrenia, unspecified: Secondary | ICD-10-CM | POA: Diagnosis present

## 2015-08-14 LAB — CBC WITH DIFFERENTIAL/PLATELET
Basophils Absolute: 0.1 10*3/uL (ref 0–0.1)
Basophils Relative: 0 %
EOS ABS: 0.1 10*3/uL (ref 0–0.7)
EOS PCT: 1 %
HCT: 45.3 % (ref 40.0–52.0)
Hemoglobin: 15 g/dL (ref 13.0–18.0)
LYMPHS ABS: 1.8 10*3/uL (ref 1.0–3.6)
LYMPHS PCT: 13 %
MCH: 29.3 pg (ref 26.0–34.0)
MCHC: 33.1 g/dL (ref 32.0–36.0)
MCV: 88.5 fL (ref 80.0–100.0)
MONO ABS: 1 10*3/uL (ref 0.2–1.0)
MONOS PCT: 7 %
Neutro Abs: 10.9 10*3/uL — ABNORMAL HIGH (ref 1.4–6.5)
Neutrophils Relative %: 79 %
Platelets: 176 10*3/uL (ref 150–440)
RBC: 5.12 MIL/uL (ref 4.40–5.90)
RDW: 13.5 % (ref 11.5–14.5)
WBC: 13.9 10*3/uL — AB (ref 3.8–10.6)

## 2015-09-13 ENCOUNTER — Other Ambulatory Visit
Admission: RE | Admit: 2015-09-13 | Discharge: 2015-09-13 | Disposition: A | Discharge status: Home / Self Care | Payer: Medicare Other | Source: Ambulatory Visit | Attending: Psychiatry | Admitting: Psychiatry

## 2015-09-13 DIAGNOSIS — F209 Schizophrenia, unspecified: Secondary | ICD-10-CM | POA: Diagnosis present

## 2015-09-13 LAB — CBC WITH DIFFERENTIAL/PLATELET
Basophils Absolute: 0.1 10*3/uL (ref 0–0.1)
Basophils Relative: 1 %
Eosinophils Absolute: 0.1 10*3/uL (ref 0–0.7)
Eosinophils Relative: 1 %
HCT: 45.6 % (ref 40.0–52.0)
Hemoglobin: 15.4 g/dL (ref 13.0–18.0)
LYMPHS PCT: 22 %
Lymphs Abs: 2 10*3/uL (ref 1.0–3.6)
MCH: 29.5 pg (ref 26.0–34.0)
MCHC: 33.7 g/dL (ref 32.0–36.0)
MCV: 87.5 fL (ref 80.0–100.0)
MONO ABS: 0.7 10*3/uL (ref 0.2–1.0)
MONOS PCT: 7 %
NEUTROS ABS: 6.4 10*3/uL (ref 1.4–6.5)
Neutrophils Relative %: 69 %
Platelets: 200 10*3/uL (ref 150–440)
RBC: 5.22 MIL/uL (ref 4.40–5.90)
RDW: 13.1 % (ref 11.5–14.5)
WBC: 9.4 10*3/uL (ref 3.8–10.6)

## 2015-10-11 ENCOUNTER — Other Ambulatory Visit
Admission: RE | Admit: 2015-10-11 | Discharge: 2015-10-11 | Disposition: A | Discharge status: Home / Self Care | Payer: Medicare Other | Source: Ambulatory Visit | Attending: Psychiatry | Admitting: Psychiatry

## 2015-10-11 DIAGNOSIS — F209 Schizophrenia, unspecified: Secondary | ICD-10-CM | POA: Diagnosis present

## 2015-10-11 LAB — CBC WITH DIFFERENTIAL/PLATELET
Basophils Absolute: 0.1 10*3/uL (ref 0–0.1)
Basophils Relative: 1 %
Eosinophils Absolute: 0.2 10*3/uL (ref 0–0.7)
Eosinophils Relative: 2 %
HEMATOCRIT: 45.1 % (ref 40.0–52.0)
HEMOGLOBIN: 15.2 g/dL (ref 13.0–18.0)
Lymphocytes Relative: 25 %
Lymphs Abs: 2.1 10*3/uL (ref 1.0–3.6)
MCH: 29.5 pg (ref 26.0–34.0)
MCHC: 33.8 g/dL (ref 32.0–36.0)
MCV: 87.5 fL (ref 80.0–100.0)
MONOS PCT: 8 %
Monocytes Absolute: 0.7 10*3/uL (ref 0.2–1.0)
NEUTROS ABS: 5.3 10*3/uL (ref 1.4–6.5)
NEUTROS PCT: 64 %
Platelets: 197 10*3/uL (ref 150–440)
RBC: 5.15 MIL/uL (ref 4.40–5.90)
RDW: 13.1 % (ref 11.5–14.5)
WBC: 8.3 10*3/uL (ref 3.8–10.6)

## 2015-11-13 ENCOUNTER — Other Ambulatory Visit
Admission: RE | Admit: 2015-11-13 | Discharge: 2015-11-13 | Disposition: A | Discharge status: Home / Self Care | Payer: Medicare Other | Source: Ambulatory Visit | Attending: Psychiatry | Admitting: Psychiatry

## 2015-11-13 DIAGNOSIS — F209 Schizophrenia, unspecified: Secondary | ICD-10-CM | POA: Diagnosis not present

## 2015-11-13 DIAGNOSIS — F17200 Nicotine dependence, unspecified, uncomplicated: Secondary | ICD-10-CM | POA: Diagnosis not present

## 2015-11-13 LAB — CBC WITH DIFFERENTIAL/PLATELET
Basophils Absolute: 0.1 10*3/uL (ref 0–0.1)
Basophils Relative: 1 %
EOS ABS: 0.1 10*3/uL (ref 0–0.7)
Eosinophils Relative: 1 %
HEMATOCRIT: 47.1 % (ref 40.0–52.0)
Hemoglobin: 15.5 g/dL (ref 13.0–18.0)
LYMPHS ABS: 1.5 10*3/uL (ref 1.0–3.6)
Lymphocytes Relative: 16 %
MCH: 29.2 pg (ref 26.0–34.0)
MCHC: 33 g/dL (ref 32.0–36.0)
MCV: 88.5 fL (ref 80.0–100.0)
Monocytes Absolute: 0.7 10*3/uL (ref 0.2–1.0)
Monocytes Relative: 7 %
NEUTROS PCT: 75 %
Neutro Abs: 7.2 10*3/uL — ABNORMAL HIGH (ref 1.4–6.5)
Platelets: 196 10*3/uL (ref 150–440)
RBC: 5.32 MIL/uL (ref 4.40–5.90)
RDW: 13.5 % (ref 11.5–14.5)
WBC: 9.6 10*3/uL (ref 3.8–10.6)

## 2015-12-11 ENCOUNTER — Other Ambulatory Visit
Admission: RE | Admit: 2015-12-11 | Discharge: 2015-12-11 | Disposition: A | Discharge status: Home / Self Care | Payer: Medicare Other | Source: Ambulatory Visit | Attending: Psychiatry | Admitting: Psychiatry

## 2015-12-11 DIAGNOSIS — F209 Schizophrenia, unspecified: Secondary | ICD-10-CM | POA: Insufficient documentation

## 2015-12-11 LAB — CBC WITH DIFFERENTIAL/PLATELET
BASOS ABS: 0.1 10*3/uL (ref 0–0.1)
BASOS PCT: 1 %
EOS PCT: 2 %
Eosinophils Absolute: 0.2 10*3/uL (ref 0–0.7)
HCT: 45.1 % (ref 40.0–52.0)
Hemoglobin: 15.2 g/dL (ref 13.0–18.0)
Lymphocytes Relative: 26 %
Lymphs Abs: 2.4 10*3/uL (ref 1.0–3.6)
MCH: 29.6 pg (ref 26.0–34.0)
MCHC: 33.8 g/dL (ref 32.0–36.0)
MCV: 87.5 fL (ref 80.0–100.0)
MONO ABS: 0.7 10*3/uL (ref 0.2–1.0)
Monocytes Relative: 8 %
Neutro Abs: 5.9 10*3/uL (ref 1.4–6.5)
Neutrophils Relative %: 63 %
PLATELETS: 182 10*3/uL (ref 150–440)
RBC: 5.15 MIL/uL (ref 4.40–5.90)
RDW: 13.8 % (ref 11.5–14.5)
WBC: 9.2 10*3/uL (ref 3.8–10.6)

## 2016-01-09 ENCOUNTER — Other Ambulatory Visit
Admission: RE | Admit: 2016-01-09 | Discharge: 2016-01-09 | Disposition: A | Discharge status: Home / Self Care | Payer: Medicare Other | Source: Ambulatory Visit | Attending: Psychiatry | Admitting: Psychiatry

## 2016-01-09 DIAGNOSIS — F209 Schizophrenia, unspecified: Secondary | ICD-10-CM | POA: Diagnosis not present

## 2016-01-09 LAB — CBC WITH DIFFERENTIAL/PLATELET
BASOS ABS: 0.1 10*3/uL (ref 0–0.1)
Basophils Relative: 1 %
EOS PCT: 2 %
Eosinophils Absolute: 0.1 10*3/uL (ref 0–0.7)
HEMATOCRIT: 45.8 % (ref 40.0–52.0)
Hemoglobin: 15.3 g/dL (ref 13.0–18.0)
LYMPHS PCT: 25 %
Lymphs Abs: 2.3 10*3/uL (ref 1.0–3.6)
MCH: 29.3 pg (ref 26.0–34.0)
MCHC: 33.3 g/dL (ref 32.0–36.0)
MCV: 87.9 fL (ref 80.0–100.0)
Monocytes Absolute: 0.7 10*3/uL (ref 0.2–1.0)
Monocytes Relative: 8 %
Neutro Abs: 6 10*3/uL (ref 1.4–6.5)
Neutrophils Relative %: 64 %
PLATELETS: 207 10*3/uL (ref 150–440)
RBC: 5.21 MIL/uL (ref 4.40–5.90)
RDW: 13.8 % (ref 11.5–14.5)
WBC: 9.2 10*3/uL (ref 3.8–10.6)

## 2016-02-05 DIAGNOSIS — F209 Schizophrenia, unspecified: Secondary | ICD-10-CM | POA: Diagnosis not present

## 2016-02-05 DIAGNOSIS — F17200 Nicotine dependence, unspecified, uncomplicated: Secondary | ICD-10-CM | POA: Diagnosis not present

## 2016-02-11 ENCOUNTER — Other Ambulatory Visit
Admission: RE | Admit: 2016-02-11 | Discharge: 2016-02-11 | Disposition: A | Discharge status: Home / Self Care | Payer: Medicare Other | Source: Ambulatory Visit | Attending: Psychiatry | Admitting: Psychiatry

## 2016-02-11 DIAGNOSIS — F209 Schizophrenia, unspecified: Secondary | ICD-10-CM | POA: Insufficient documentation

## 2016-02-11 LAB — CBC WITH DIFFERENTIAL/PLATELET
BASOS PCT: 1 %
Basophils Absolute: 0.1 10*3/uL (ref 0–0.1)
EOS ABS: 0.2 10*3/uL (ref 0–0.7)
EOS PCT: 2 %
HCT: 44.5 % (ref 40.0–52.0)
Hemoglobin: 14.7 g/dL (ref 13.0–18.0)
LYMPHS ABS: 1.7 10*3/uL (ref 1.0–3.6)
Lymphocytes Relative: 17 %
MCH: 29.3 pg (ref 26.0–34.0)
MCHC: 33.1 g/dL (ref 32.0–36.0)
MCV: 88.5 fL (ref 80.0–100.0)
MONOS PCT: 6 %
Monocytes Absolute: 0.6 10*3/uL (ref 0.2–1.0)
Neutro Abs: 7.9 10*3/uL — ABNORMAL HIGH (ref 1.4–6.5)
Neutrophils Relative %: 74 %
PLATELETS: 190 10*3/uL (ref 150–440)
RBC: 5.03 MIL/uL (ref 4.40–5.90)
RDW: 13.8 % (ref 11.5–14.5)
WBC: 10.5 10*3/uL (ref 3.8–10.6)

## 2016-03-10 ENCOUNTER — Other Ambulatory Visit
Admission: RE | Admit: 2016-03-10 | Discharge: 2016-03-10 | Disposition: A | Discharge status: Home / Self Care | Payer: Medicare Other | Source: Ambulatory Visit | Attending: Psychiatry | Admitting: Psychiatry

## 2016-03-10 DIAGNOSIS — F209 Schizophrenia, unspecified: Secondary | ICD-10-CM | POA: Insufficient documentation

## 2016-03-10 LAB — CBC WITH DIFFERENTIAL/PLATELET
Basophils Absolute: 0.1 10*3/uL (ref 0–0.1)
EOS ABS: 0.1 10*3/uL (ref 0–0.7)
Eosinophils Relative: 2 %
HEMATOCRIT: 44.2 % (ref 40.0–52.0)
Hemoglobin: 14.7 g/dL (ref 13.0–18.0)
Lymphocytes Relative: 19 %
Lymphs Abs: 1.7 10*3/uL (ref 1.0–3.6)
MCH: 29.6 pg (ref 26.0–34.0)
MCHC: 33.2 g/dL (ref 32.0–36.0)
MCV: 88.9 fL (ref 80.0–100.0)
Monocytes Absolute: 0.7 10*3/uL (ref 0.2–1.0)
NEUTROS ABS: 6.4 10*3/uL (ref 1.4–6.5)
Platelets: 175 10*3/uL (ref 150–440)
RBC: 4.96 MIL/uL (ref 4.40–5.90)
RDW: 13.6 % (ref 11.5–14.5)
WBC: 8.9 10*3/uL (ref 3.8–10.6)

## 2016-04-10 ENCOUNTER — Other Ambulatory Visit
Admission: RE | Admit: 2016-04-10 | Discharge: 2016-04-10 | Disposition: A | Discharge status: Home / Self Care | Payer: Medicare Other | Source: Ambulatory Visit | Attending: Psychiatry | Admitting: Psychiatry

## 2016-04-10 DIAGNOSIS — F209 Schizophrenia, unspecified: Secondary | ICD-10-CM | POA: Insufficient documentation

## 2016-04-10 LAB — CBC WITH DIFFERENTIAL/PLATELET
Basophils Absolute: 0 10*3/uL (ref 0–0.1)
Basophils Relative: 1 %
Eosinophils Absolute: 0.1 10*3/uL (ref 0–0.7)
HCT: 45.1 % (ref 40.0–52.0)
Hemoglobin: 15.2 g/dL (ref 13.0–18.0)
Lymphocytes Relative: 22 %
Lymphs Abs: 1.9 10*3/uL (ref 1.0–3.6)
MCH: 29.3 pg (ref 26.0–34.0)
MCHC: 33.6 g/dL (ref 32.0–36.0)
MCV: 87.3 fL (ref 80.0–100.0)
MONO ABS: 0.7 10*3/uL (ref 0.2–1.0)
Monocytes Relative: 8 %
Neutro Abs: 5.9 10*3/uL (ref 1.4–6.5)
Neutrophils Relative %: 67 %
Platelets: 233 10*3/uL (ref 150–440)
RBC: 5.17 MIL/uL (ref 4.40–5.90)
RDW: 13.2 % (ref 11.5–14.5)
WBC: 8.7 10*3/uL (ref 3.8–10.6)

## 2016-04-15 DIAGNOSIS — Z872 Personal history of diseases of the skin and subcutaneous tissue: Secondary | ICD-10-CM | POA: Diagnosis not present

## 2016-04-15 DIAGNOSIS — Z1283 Encounter for screening for malignant neoplasm of skin: Secondary | ICD-10-CM | POA: Diagnosis not present

## 2016-04-15 DIAGNOSIS — D485 Neoplasm of uncertain behavior of skin: Secondary | ICD-10-CM | POA: Diagnosis not present

## 2016-04-22 DIAGNOSIS — D485 Neoplasm of uncertain behavior of skin: Secondary | ICD-10-CM | POA: Diagnosis not present

## 2016-04-22 DIAGNOSIS — D2221 Melanocytic nevi of right ear and external auricular canal: Secondary | ICD-10-CM | POA: Diagnosis not present

## 2016-05-08 ENCOUNTER — Other Ambulatory Visit
Admission: RE | Admit: 2016-05-08 | Discharge: 2016-05-08 | Disposition: A | Discharge status: Home / Self Care | Payer: Medicare Other | Source: Ambulatory Visit | Attending: Psychiatry | Admitting: Psychiatry

## 2016-05-08 DIAGNOSIS — F209 Schizophrenia, unspecified: Secondary | ICD-10-CM | POA: Diagnosis not present

## 2016-05-08 LAB — CBC WITH DIFFERENTIAL/PLATELET
Basophils Absolute: 0.1 10*3/uL (ref 0–0.1)
Basophils Relative: 1 %
Eosinophils Absolute: 0.2 10*3/uL (ref 0–0.7)
HEMATOCRIT: 45.9 % (ref 40.0–52.0)
Hemoglobin: 15.5 g/dL (ref 13.0–18.0)
LYMPHS ABS: 1.9 10*3/uL (ref 1.0–3.6)
MCH: 29.5 pg (ref 26.0–34.0)
MCHC: 33.8 g/dL (ref 32.0–36.0)
MCV: 87.3 fL (ref 80.0–100.0)
MONO ABS: 0.7 10*3/uL (ref 0.2–1.0)
NEUTROS ABS: 6.3 10*3/uL (ref 1.4–6.5)
Neutrophils Relative %: 68 %
PLATELETS: 197 10*3/uL (ref 150–440)
RBC: 5.26 MIL/uL (ref 4.40–5.90)
RDW: 13 % (ref 11.5–14.5)
WBC: 9.1 10*3/uL (ref 3.8–10.6)

## 2016-05-20 DIAGNOSIS — F209 Schizophrenia, unspecified: Secondary | ICD-10-CM | POA: Diagnosis not present

## 2016-06-09 ENCOUNTER — Other Ambulatory Visit
Admission: RE | Admit: 2016-06-09 | Discharge: 2016-06-09 | Disposition: A | Discharge status: Home / Self Care | Payer: Medicare Other | Source: Ambulatory Visit | Attending: Psychiatry | Admitting: Psychiatry

## 2016-06-11 ENCOUNTER — Other Ambulatory Visit
Admission: RE | Admit: 2016-06-11 | Discharge: 2016-06-11 | Disposition: A | Discharge status: Home / Self Care | Payer: Medicare Other | Source: Ambulatory Visit | Attending: Psychiatry | Admitting: Psychiatry

## 2016-06-11 DIAGNOSIS — F209 Schizophrenia, unspecified: Secondary | ICD-10-CM | POA: Diagnosis not present

## 2016-06-11 LAB — CBC WITH DIFFERENTIAL/PLATELET
Basophils Absolute: 0.1 10*3/uL (ref 0–0.1)
Basophils Relative: 1 %
EOS ABS: 0.2 10*3/uL (ref 0–0.7)
EOS PCT: 2 %
HCT: 45.6 % (ref 40.0–52.0)
Hemoglobin: 15.3 g/dL (ref 13.0–18.0)
LYMPHS ABS: 1.7 10*3/uL (ref 1.0–3.6)
LYMPHS PCT: 18 %
MCH: 29.3 pg (ref 26.0–34.0)
MCHC: 33.5 g/dL (ref 32.0–36.0)
MCV: 87.5 fL (ref 80.0–100.0)
MONO ABS: 0.8 10*3/uL (ref 0.2–1.0)
MONOS PCT: 9 %
Neutro Abs: 6.4 10*3/uL (ref 1.4–6.5)
Neutrophils Relative %: 70 %
PLATELETS: 188 10*3/uL (ref 150–440)
RBC: 5.21 MIL/uL (ref 4.40–5.90)
RDW: 13.2 % (ref 11.5–14.5)
WBC: 9.1 10*3/uL (ref 3.8–10.6)

## 2016-07-10 ENCOUNTER — Other Ambulatory Visit
Admission: RE | Admit: 2016-07-10 | Discharge: 2016-07-10 | Disposition: A | Discharge status: Home / Self Care | Payer: Medicare Other | Source: Ambulatory Visit | Attending: Psychiatry | Admitting: Psychiatry

## 2016-07-10 DIAGNOSIS — F209 Schizophrenia, unspecified: Secondary | ICD-10-CM | POA: Insufficient documentation

## 2016-07-10 LAB — CBC WITH DIFFERENTIAL/PLATELET
BASOS ABS: 0 10*3/uL (ref 0–0.1)
BASOS PCT: 0 %
EOS ABS: 0.2 10*3/uL (ref 0–0.7)
EOS PCT: 2 %
HCT: 44.9 % (ref 40.0–52.0)
Hemoglobin: 15.2 g/dL (ref 13.0–18.0)
Lymphocytes Relative: 20 %
Lymphs Abs: 1.8 10*3/uL (ref 1.0–3.6)
MCH: 29.5 pg (ref 26.0–34.0)
MCHC: 33.8 g/dL (ref 32.0–36.0)
MCV: 87.4 fL (ref 80.0–100.0)
MONO ABS: 0.6 10*3/uL (ref 0.2–1.0)
MONOS PCT: 6 %
Neutro Abs: 6.3 10*3/uL (ref 1.4–6.5)
Neutrophils Relative %: 72 %
PLATELETS: 200 10*3/uL (ref 150–440)
RBC: 5.14 MIL/uL (ref 4.40–5.90)
RDW: 13.2 % (ref 11.5–14.5)
WBC: 8.8 10*3/uL (ref 3.8–10.6)

## 2016-08-05 ENCOUNTER — Other Ambulatory Visit
Admission: RE | Admit: 2016-08-05 | Discharge: 2016-08-05 | Disposition: A | Discharge status: Home / Self Care | Payer: Medicare Other | Source: Ambulatory Visit | Attending: Psychiatry | Admitting: Psychiatry

## 2016-08-05 DIAGNOSIS — F209 Schizophrenia, unspecified: Secondary | ICD-10-CM | POA: Diagnosis not present

## 2016-08-05 LAB — CBC WITH DIFFERENTIAL/PLATELET
Basophils Absolute: 0.1 10*3/uL (ref 0–0.1)
Basophils Relative: 1 %
Eosinophils Absolute: 0.3 10*3/uL (ref 0–0.7)
Eosinophils Relative: 3 %
HCT: 44.5 % (ref 40.0–52.0)
Hemoglobin: 14.9 g/dL (ref 13.0–18.0)
LYMPHS PCT: 28 %
Lymphs Abs: 2.3 10*3/uL (ref 1.0–3.6)
MCH: 29.3 pg (ref 26.0–34.0)
MCHC: 33.5 g/dL (ref 32.0–36.0)
MCV: 87.2 fL (ref 80.0–100.0)
MONO ABS: 0.7 10*3/uL (ref 0.2–1.0)
Monocytes Relative: 8 %
Neutro Abs: 4.8 10*3/uL (ref 1.4–6.5)
Neutrophils Relative %: 60 %
PLATELETS: 170 10*3/uL (ref 150–440)
RBC: 5.11 MIL/uL (ref 4.40–5.90)
RDW: 13.1 % (ref 11.5–14.5)
WBC: 8.1 10*3/uL (ref 3.8–10.6)

## 2016-08-13 DIAGNOSIS — Z23 Encounter for immunization: Secondary | ICD-10-CM | POA: Diagnosis not present

## 2016-08-19 DIAGNOSIS — F209 Schizophrenia, unspecified: Secondary | ICD-10-CM | POA: Diagnosis not present

## 2016-09-04 DIAGNOSIS — K219 Gastro-esophageal reflux disease without esophagitis: Secondary | ICD-10-CM | POA: Diagnosis not present

## 2016-09-04 DIAGNOSIS — F209 Schizophrenia, unspecified: Secondary | ICD-10-CM | POA: Diagnosis not present

## 2016-09-04 DIAGNOSIS — Z23 Encounter for immunization: Secondary | ICD-10-CM | POA: Diagnosis not present

## 2016-09-04 DIAGNOSIS — Z0000 Encounter for general adult medical examination without abnormal findings: Secondary | ICD-10-CM | POA: Diagnosis not present

## 2016-09-10 ENCOUNTER — Other Ambulatory Visit
Admission: RE | Admit: 2016-09-10 | Discharge: 2016-09-10 | Disposition: A | Discharge status: Home / Self Care | Payer: Medicare Other | Source: Ambulatory Visit | Attending: Psychiatry | Admitting: Psychiatry

## 2016-09-10 DIAGNOSIS — F209 Schizophrenia, unspecified: Secondary | ICD-10-CM | POA: Insufficient documentation

## 2016-09-10 LAB — CBC WITH DIFFERENTIAL/PLATELET
Basophils Absolute: 0.1 10*3/uL (ref 0–0.1)
Basophils Relative: 1 %
Eosinophils Absolute: 0.2 10*3/uL (ref 0–0.7)
Eosinophils Relative: 3 %
HCT: 44.6 % (ref 40.0–52.0)
Hemoglobin: 15 g/dL (ref 13.0–18.0)
LYMPHS ABS: 2.3 10*3/uL (ref 1.0–3.6)
LYMPHS PCT: 30 %
MCH: 29.3 pg (ref 26.0–34.0)
MCHC: 33.5 g/dL (ref 32.0–36.0)
MCV: 87.5 fL (ref 80.0–100.0)
MONOS PCT: 8 %
Monocytes Absolute: 0.6 10*3/uL (ref 0.2–1.0)
NEUTROS PCT: 58 %
Neutro Abs: 4.6 10*3/uL (ref 1.4–6.5)
Platelets: 196 10*3/uL (ref 150–440)
RBC: 5.1 MIL/uL (ref 4.40–5.90)
RDW: 13.3 % (ref 11.5–14.5)
WBC: 7.9 10*3/uL (ref 3.8–10.6)

## 2016-10-08 ENCOUNTER — Other Ambulatory Visit
Admission: RE | Admit: 2016-10-08 | Discharge: 2016-10-08 | Disposition: A | Discharge status: Home / Self Care | Payer: Medicare Other | Source: Ambulatory Visit | Attending: Psychiatry | Admitting: Psychiatry

## 2016-10-08 DIAGNOSIS — F209 Schizophrenia, unspecified: Secondary | ICD-10-CM | POA: Insufficient documentation

## 2016-10-08 LAB — CBC WITH DIFFERENTIAL/PLATELET
Basophils Absolute: 0.1 10*3/uL (ref 0–0.1)
Basophils Relative: 1 %
Eosinophils Absolute: 0.2 10*3/uL (ref 0–0.7)
Eosinophils Relative: 2 %
HEMATOCRIT: 44.3 % (ref 40.0–52.0)
Hemoglobin: 14.9 g/dL (ref 13.0–18.0)
Lymphocytes Relative: 20 %
Lymphs Abs: 2 10*3/uL (ref 1.0–3.6)
MCH: 29.4 pg (ref 26.0–34.0)
MCHC: 33.7 g/dL (ref 32.0–36.0)
MCV: 87.4 fL (ref 80.0–100.0)
MONO ABS: 0.7 10*3/uL (ref 0.2–1.0)
MONOS PCT: 7 %
Neutro Abs: 7.1 10*3/uL — ABNORMAL HIGH (ref 1.4–6.5)
Neutrophils Relative %: 70 %
Platelets: 212 10*3/uL (ref 150–440)
RBC: 5.07 MIL/uL (ref 4.40–5.90)
RDW: 13.4 % (ref 11.5–14.5)
WBC: 10.1 10*3/uL (ref 3.8–10.6)

## 2016-10-20 DIAGNOSIS — Z1283 Encounter for screening for malignant neoplasm of skin: Secondary | ICD-10-CM | POA: Diagnosis not present

## 2016-10-20 DIAGNOSIS — S60861A Insect bite (nonvenomous) of right wrist, initial encounter: Secondary | ICD-10-CM | POA: Diagnosis not present

## 2016-10-20 DIAGNOSIS — Z872 Personal history of diseases of the skin and subcutaneous tissue: Secondary | ICD-10-CM | POA: Diagnosis not present

## 2016-11-11 ENCOUNTER — Other Ambulatory Visit
Admission: RE | Admit: 2016-11-11 | Discharge: 2016-11-11 | Disposition: A | Discharge status: Home / Self Care | Payer: Medicare Other | Source: Ambulatory Visit | Attending: Psychiatry | Admitting: Psychiatry

## 2016-11-11 DIAGNOSIS — F209 Schizophrenia, unspecified: Secondary | ICD-10-CM | POA: Insufficient documentation

## 2016-11-11 LAB — CBC WITH DIFFERENTIAL/PLATELET
Basophils Absolute: 0 10*3/uL (ref 0–0.1)
Basophils Relative: 1 %
Eosinophils Absolute: 0.1 10*3/uL (ref 0–0.7)
Eosinophils Relative: 2 %
HCT: 44.2 % (ref 40.0–52.0)
Hemoglobin: 14.5 g/dL (ref 13.0–18.0)
LYMPHS ABS: 1.8 10*3/uL (ref 1.0–3.6)
LYMPHS PCT: 21 %
MCH: 29 pg (ref 26.0–34.0)
MCHC: 32.8 g/dL (ref 32.0–36.0)
MCV: 88.4 fL (ref 80.0–100.0)
MONO ABS: 0.6 10*3/uL (ref 0.2–1.0)
Monocytes Relative: 7 %
Neutro Abs: 5.9 10*3/uL (ref 1.4–6.5)
Neutrophils Relative %: 69 %
PLATELETS: 173 10*3/uL (ref 150–440)
RBC: 5 MIL/uL (ref 4.40–5.90)
RDW: 13.9 % (ref 11.5–14.5)
WBC: 8.5 10*3/uL (ref 3.8–10.6)

## 2016-11-25 DIAGNOSIS — F209 Schizophrenia, unspecified: Secondary | ICD-10-CM | POA: Diagnosis not present

## 2016-12-02 ENCOUNTER — Other Ambulatory Visit
Admission: RE | Admit: 2016-12-02 | Discharge: 2016-12-02 | Disposition: A | Discharge status: Home / Self Care | Payer: Medicare Other | Source: Ambulatory Visit | Attending: Psychiatry | Admitting: Psychiatry

## 2016-12-02 DIAGNOSIS — F209 Schizophrenia, unspecified: Secondary | ICD-10-CM | POA: Insufficient documentation

## 2016-12-02 LAB — CBC WITH DIFFERENTIAL/PLATELET
BASOS ABS: 0.1 10*3/uL (ref 0–0.1)
Basophils Relative: 1 %
Eosinophils Absolute: 0.1 10*3/uL (ref 0–0.7)
Eosinophils Relative: 2 %
HCT: 44.1 % (ref 40.0–52.0)
Hemoglobin: 14.8 g/dL (ref 13.0–18.0)
Lymphocytes Relative: 22 %
Lymphs Abs: 1.8 10*3/uL (ref 1.0–3.6)
MCH: 29.6 pg (ref 26.0–34.0)
MCHC: 33.6 g/dL (ref 32.0–36.0)
MCV: 88.1 fL (ref 80.0–100.0)
MONO ABS: 0.7 10*3/uL (ref 0.2–1.0)
Monocytes Relative: 8 %
NEUTROS PCT: 67 %
Neutro Abs: 5.6 10*3/uL (ref 1.4–6.5)
Platelets: 198 10*3/uL (ref 150–440)
RBC: 5.01 MIL/uL (ref 4.40–5.90)
RDW: 13.4 % (ref 11.5–14.5)
WBC: 8.3 10*3/uL (ref 3.8–10.6)

## 2017-01-02 ENCOUNTER — Other Ambulatory Visit
Admission: RE | Admit: 2017-01-02 | Discharge: 2017-01-02 | Disposition: A | Discharge status: Home / Self Care | Payer: Medicare Other | Source: Ambulatory Visit | Attending: Psychiatry | Admitting: Psychiatry

## 2017-01-02 DIAGNOSIS — F209 Schizophrenia, unspecified: Secondary | ICD-10-CM | POA: Insufficient documentation

## 2017-01-02 LAB — CBC WITH DIFFERENTIAL/PLATELET
BASOS ABS: 0.1 10*3/uL (ref 0–0.1)
BASOS PCT: 1 %
Eosinophils Absolute: 0.1 10*3/uL (ref 0–0.7)
Eosinophils Relative: 2 %
HEMATOCRIT: 44.9 % (ref 40.0–52.0)
Hemoglobin: 14.9 g/dL (ref 13.0–18.0)
LYMPHS PCT: 23 %
Lymphs Abs: 1.9 10*3/uL (ref 1.0–3.6)
MCH: 29.5 pg (ref 26.0–34.0)
MCHC: 33.3 g/dL (ref 32.0–36.0)
MCV: 88.6 fL (ref 80.0–100.0)
MONO ABS: 0.6 10*3/uL (ref 0.2–1.0)
Monocytes Relative: 7 %
NEUTROS ABS: 5.5 10*3/uL (ref 1.4–6.5)
Neutrophils Relative %: 67 %
PLATELETS: 200 10*3/uL (ref 150–440)
RBC: 5.06 MIL/uL (ref 4.40–5.90)
RDW: 12.9 % (ref 11.5–14.5)
WBC: 8.1 10*3/uL (ref 3.8–10.6)

## 2017-02-02 ENCOUNTER — Other Ambulatory Visit
Admission: RE | Admit: 2017-02-02 | Discharge: 2017-02-02 | Disposition: A | Discharge status: Home / Self Care | Payer: Medicare Other | Source: Ambulatory Visit | Attending: Psychiatry | Admitting: Psychiatry

## 2017-02-02 DIAGNOSIS — F209 Schizophrenia, unspecified: Secondary | ICD-10-CM | POA: Diagnosis not present

## 2017-02-02 LAB — CBC WITH DIFFERENTIAL/PLATELET
BASOS PCT: 1 %
Basophils Absolute: 0.1 10*3/uL (ref 0–0.1)
Eosinophils Absolute: 0.2 10*3/uL (ref 0–0.7)
Eosinophils Relative: 2 %
HEMATOCRIT: 44.8 % (ref 40.0–52.0)
HEMOGLOBIN: 15 g/dL (ref 13.0–18.0)
Lymphocytes Relative: 19 %
Lymphs Abs: 1.8 10*3/uL (ref 1.0–3.6)
MCH: 29.4 pg (ref 26.0–34.0)
MCHC: 33.5 g/dL (ref 32.0–36.0)
MCV: 87.8 fL (ref 80.0–100.0)
Monocytes Absolute: 0.8 10*3/uL (ref 0.2–1.0)
Monocytes Relative: 9 %
NEUTROS ABS: 6.5 10*3/uL (ref 1.4–6.5)
NEUTROS PCT: 69 %
Platelets: 209 10*3/uL (ref 150–440)
RBC: 5.11 MIL/uL (ref 4.40–5.90)
RDW: 12.9 % (ref 11.5–14.5)
WBC: 9.3 10*3/uL (ref 3.8–10.6)

## 2017-02-24 DIAGNOSIS — F209 Schizophrenia, unspecified: Secondary | ICD-10-CM | POA: Diagnosis not present

## 2017-03-04 ENCOUNTER — Other Ambulatory Visit
Admission: RE | Admit: 2017-03-04 | Discharge: 2017-03-04 | Disposition: A | Discharge status: Home / Self Care | Payer: Medicare Other | Source: Ambulatory Visit | Attending: Psychiatry | Admitting: Psychiatry

## 2017-03-04 DIAGNOSIS — F209 Schizophrenia, unspecified: Secondary | ICD-10-CM | POA: Insufficient documentation

## 2017-03-04 LAB — CBC WITH DIFFERENTIAL/PLATELET
BASOS PCT: 1 %
Basophils Absolute: 0.1 10*3/uL (ref 0–0.1)
Eosinophils Absolute: 0.3 10*3/uL (ref 0–0.7)
Eosinophils Relative: 3 %
HCT: 46.2 % (ref 40.0–52.0)
HEMOGLOBIN: 15.7 g/dL (ref 13.0–18.0)
LYMPHS ABS: 2 10*3/uL (ref 1.0–3.6)
LYMPHS PCT: 18 %
MCH: 29.9 pg (ref 26.0–34.0)
MCHC: 34 g/dL (ref 32.0–36.0)
MCV: 87.9 fL (ref 80.0–100.0)
MONO ABS: 0.9 10*3/uL (ref 0.2–1.0)
MONOS PCT: 8 %
NEUTROS ABS: 7.6 10*3/uL — AB (ref 1.4–6.5)
NEUTROS PCT: 70 %
Platelets: 201 10*3/uL (ref 150–440)
RBC: 5.25 MIL/uL (ref 4.40–5.90)
RDW: 13 % (ref 11.5–14.5)
WBC: 10.8 10*3/uL — ABNORMAL HIGH (ref 3.8–10.6)

## 2017-04-02 ENCOUNTER — Other Ambulatory Visit
Admission: RE | Admit: 2017-04-02 | Discharge: 2017-04-02 | Disposition: A | Discharge status: Home / Self Care | Payer: Medicare Other | Source: Ambulatory Visit | Attending: Psychiatry | Admitting: Psychiatry

## 2017-04-02 DIAGNOSIS — F209 Schizophrenia, unspecified: Secondary | ICD-10-CM | POA: Diagnosis not present

## 2017-04-02 LAB — CBC WITH DIFFERENTIAL/PLATELET
Basophils Absolute: 0.1 10*3/uL (ref 0–0.1)
Basophils Relative: 1 %
Eosinophils Absolute: 0.2 10*3/uL (ref 0–0.7)
Eosinophils Relative: 2 %
HEMATOCRIT: 45.4 % (ref 40.0–52.0)
HEMOGLOBIN: 15.1 g/dL (ref 13.0–18.0)
Lymphocytes Relative: 17 %
Lymphs Abs: 1.8 10*3/uL (ref 1.0–3.6)
MCH: 29.5 pg (ref 26.0–34.0)
MCHC: 33.3 g/dL (ref 32.0–36.0)
MCV: 88.4 fL (ref 80.0–100.0)
MONOS PCT: 8 %
Monocytes Absolute: 0.9 10*3/uL (ref 0.2–1.0)
NEUTROS ABS: 7.6 10*3/uL — AB (ref 1.4–6.5)
NEUTROS PCT: 72 %
Platelets: 208 10*3/uL (ref 150–440)
RBC: 5.13 MIL/uL (ref 4.40–5.90)
RDW: 13.1 % (ref 11.5–14.5)
WBC: 10.5 10*3/uL (ref 3.8–10.6)

## 2017-05-04 ENCOUNTER — Other Ambulatory Visit
Admission: RE | Admit: 2017-05-04 | Discharge: 2017-05-04 | Disposition: A | Discharge status: Home / Self Care | Payer: Medicare Other | Source: Ambulatory Visit | Attending: Psychiatry | Admitting: Psychiatry

## 2017-05-04 DIAGNOSIS — F209 Schizophrenia, unspecified: Secondary | ICD-10-CM | POA: Diagnosis not present

## 2017-05-04 LAB — CBC WITH DIFFERENTIAL/PLATELET
Basophils Absolute: 0 10*3/uL (ref 0–0.1)
Basophils Relative: 1 %
Eosinophils Absolute: 0.2 10*3/uL (ref 0–0.7)
Eosinophils Relative: 2 %
HEMATOCRIT: 48.6 % (ref 40.0–52.0)
HEMOGLOBIN: 15.9 g/dL (ref 13.0–18.0)
LYMPHS ABS: 1.7 10*3/uL (ref 1.0–3.6)
Lymphocytes Relative: 24 %
MCH: 29.1 pg (ref 26.0–34.0)
MCHC: 32.7 g/dL (ref 32.0–36.0)
MCV: 88.9 fL (ref 80.0–100.0)
MONOS PCT: 8 %
Monocytes Absolute: 0.6 10*3/uL (ref 0.2–1.0)
NEUTROS ABS: 4.8 10*3/uL (ref 1.4–6.5)
NEUTROS PCT: 65 %
Platelets: 194 10*3/uL (ref 150–440)
RBC: 5.46 MIL/uL (ref 4.40–5.90)
RDW: 13.3 % (ref 11.5–14.5)
WBC: 7.3 10*3/uL (ref 3.8–10.6)

## 2017-05-20 ENCOUNTER — Other Ambulatory Visit
Admission: RE | Admit: 2017-05-20 | Discharge: 2017-05-20 | Disposition: A | Discharge status: Home / Self Care | Payer: Medicare Other | Source: Ambulatory Visit | Attending: Psychiatry | Admitting: Psychiatry

## 2017-05-20 DIAGNOSIS — F209 Schizophrenia, unspecified: Secondary | ICD-10-CM | POA: Diagnosis not present

## 2017-05-20 LAB — CBC WITH DIFFERENTIAL/PLATELET
Basophils Absolute: 0.1 10*3/uL (ref 0–0.1)
Basophils Relative: 1 %
Eosinophils Absolute: 0.1 10*3/uL (ref 0–0.7)
Eosinophils Relative: 1 %
HCT: 43.2 % (ref 40.0–52.0)
HEMOGLOBIN: 14.5 g/dL (ref 13.0–18.0)
LYMPHS ABS: 1.4 10*3/uL (ref 1.0–3.6)
LYMPHS PCT: 12 %
MCH: 29.6 pg (ref 26.0–34.0)
MCHC: 33.6 g/dL (ref 32.0–36.0)
MCV: 88 fL (ref 80.0–100.0)
Monocytes Absolute: 0.7 10*3/uL (ref 0.2–1.0)
Monocytes Relative: 6 %
NEUTROS PCT: 80 %
Neutro Abs: 9.3 10*3/uL — ABNORMAL HIGH (ref 1.4–6.5)
Platelets: 187 10*3/uL (ref 150–440)
RBC: 4.91 MIL/uL (ref 4.40–5.90)
RDW: 13.3 % (ref 11.5–14.5)
WBC: 11.5 10*3/uL — AB (ref 3.8–10.6)

## 2017-05-26 DIAGNOSIS — Z79899 Other long term (current) drug therapy: Secondary | ICD-10-CM | POA: Diagnosis not present

## 2017-05-26 DIAGNOSIS — F209 Schizophrenia, unspecified: Secondary | ICD-10-CM | POA: Diagnosis not present

## 2017-05-26 DIAGNOSIS — F205 Residual schizophrenia: Secondary | ICD-10-CM | POA: Diagnosis not present

## 2017-06-22 ENCOUNTER — Other Ambulatory Visit
Admission: RE | Admit: 2017-06-22 | Discharge: 2017-06-22 | Disposition: A | Discharge status: Home / Self Care | Payer: Medicare Other | Source: Ambulatory Visit | Attending: Psychiatry | Admitting: Psychiatry

## 2017-06-22 DIAGNOSIS — Z79899 Other long term (current) drug therapy: Secondary | ICD-10-CM | POA: Diagnosis not present

## 2017-06-22 DIAGNOSIS — F29 Unspecified psychosis not due to a substance or known physiological condition: Secondary | ICD-10-CM | POA: Diagnosis not present

## 2017-06-22 LAB — CBC WITH DIFFERENTIAL/PLATELET
Basophils Absolute: 0.1 10*3/uL (ref 0–0.1)
Basophils Relative: 1 %
EOS ABS: 0.2 10*3/uL (ref 0–0.7)
EOS PCT: 1 %
HCT: 44.1 % (ref 40.0–52.0)
HEMOGLOBIN: 14.7 g/dL (ref 13.0–18.0)
LYMPHS ABS: 1.7 10*3/uL (ref 1.0–3.6)
Lymphocytes Relative: 16 %
MCH: 29.6 pg (ref 26.0–34.0)
MCHC: 33.4 g/dL (ref 32.0–36.0)
MCV: 88.6 fL (ref 80.0–100.0)
MONO ABS: 0.6 10*3/uL (ref 0.2–1.0)
MONOS PCT: 6 %
Neutro Abs: 8.2 10*3/uL — ABNORMAL HIGH (ref 1.4–6.5)
Neutrophils Relative %: 76 %
Platelets: 187 10*3/uL (ref 150–440)
RBC: 4.98 MIL/uL (ref 4.40–5.90)
RDW: 13.1 % (ref 11.5–14.5)
WBC: 10.7 10*3/uL — ABNORMAL HIGH (ref 3.8–10.6)

## 2017-06-22 LAB — LIPID PANEL
CHOLESTEROL: 152 mg/dL (ref 0–200)
HDL: 56 mg/dL (ref >40)
LDL CALC: 68 mg/dL (ref 0–99)
Total CHOL/HDL Ratio: 2.7 RATIO
Triglycerides: 142 mg/dL (ref <150)
VLDL: 28 mg/dL (ref 0–40)

## 2017-06-23 LAB — MISC LABCORP TEST (SEND OUT): Labcorp test code: 1453

## 2017-07-21 ENCOUNTER — Other Ambulatory Visit
Admission: RE | Admit: 2017-07-21 | Discharge: 2017-07-21 | Disposition: A | Discharge status: Home / Self Care | Payer: Medicare Other | Source: Ambulatory Visit | Attending: Psychiatry | Admitting: Psychiatry

## 2017-07-21 DIAGNOSIS — Z79899 Other long term (current) drug therapy: Secondary | ICD-10-CM | POA: Diagnosis not present

## 2017-07-21 LAB — CBC WITH DIFFERENTIAL/PLATELET
BASOS ABS: 0.1 10*3/uL (ref 0–0.1)
BASOS PCT: 1 %
EOS ABS: 0.1 10*3/uL (ref 0–0.7)
Eosinophils Relative: 1 %
HCT: 43.9 % (ref 40.0–52.0)
HEMOGLOBIN: 14.8 g/dL (ref 13.0–18.0)
LYMPHS ABS: 1.9 10*3/uL (ref 1.0–3.6)
Lymphocytes Relative: 21 %
MCH: 29.6 pg (ref 26.0–34.0)
MCHC: 33.7 g/dL (ref 32.0–36.0)
MCV: 87.7 fL (ref 80.0–100.0)
Monocytes Absolute: 0.6 10*3/uL (ref 0.2–1.0)
Monocytes Relative: 7 %
NEUTROS PCT: 70 %
Neutro Abs: 6.3 10*3/uL (ref 1.4–6.5)
PLATELETS: 198 10*3/uL (ref 150–440)
RBC: 5 MIL/uL (ref 4.40–5.90)
RDW: 13.2 % (ref 11.5–14.5)
WBC: 9 10*3/uL (ref 3.8–10.6)

## 2017-08-11 ENCOUNTER — Ambulatory Visit
Admission: EM | Admit: 2017-08-11 | Discharge: 2017-08-11 | Disposition: A | Discharge status: Home / Self Care | Payer: Medicare Other | Attending: Family Medicine | Admitting: Family Medicine

## 2017-08-11 ENCOUNTER — Encounter: Payer: Self-pay | Admitting: Emergency Medicine

## 2017-08-11 DIAGNOSIS — S50861A Insect bite (nonvenomous) of right forearm, initial encounter: Secondary | ICD-10-CM

## 2017-08-11 DIAGNOSIS — W57XXXA Bitten or stung by nonvenomous insect and other nonvenomous arthropods, initial encounter: Secondary | ICD-10-CM

## 2017-08-11 HISTORY — DX: Schizophrenia, unspecified: F20.9

## 2017-08-11 HISTORY — DX: Gastro-esophageal reflux disease without esophagitis: K21.9

## 2017-08-11 MED ORDER — MUPIROCIN 2 % EX OINT: 1.0000 "application " | TOPICAL_OINTMENT | Freq: Three times a day (TID) | CUTANEOUS | 0 refills | Status: AC

## 2017-08-11 NOTE — ED Provider Notes (Signed)
[facility]    CSN: [id number] Arrival date & time: 08/11/17  1523     History   Chief Complaint Chief Complaint  Patient presents with  . Insect Bite    HPI Steven Marshall is a 41 y.o. male.   HPI  This a 41 year old male who is accompanied by his stepfather presents with a bite on his right forearm that he noticed this afternoon. Certain that he had a spider bite. He has had a spider bite in the past that had become infected. He has no pain or itching he did not feel anything bite him but he has an area of redness that he has noticed only today. [place] He has 1 cat that lives indoors and outdoors both. Patient has a history of schizophrenia.         Past Medical History:  Diagnosis Date  . GERD (gastroesophageal reflux disease)   . Schizophrenia ([place])     There are no active problems to display for this patient.   History reviewed. No pertinent surgical history.     Home Medications    Prior to Admission medications   Medication Sig Start Date End Date Taking? Authorizing Provider  cloZAPine (CLOZARIL) 100 MG tablet Take 600 mg by mouth daily.   Yes [provider]  omeprazole (PRILOSEC) 40 MG capsule Take 40 mg by mouth at bedtime.   Yes [provider]  traZODone (DESYREL) 50 MG tablet Take 50 mg by mouth at bedtime.   Yes [provider]  mupirocin ointment (BACTROBAN) 2 % Apply 1 application topically 3 (three) times daily. 08/11/17   Lorin Picket, PA-C    Family History Family History  Problem Relation Age of Onset  . COPD Mother     Social History Social History  Substance Use Topics  . Smoking status: Current Every Day Smoker    Packs/day: 1.00    Types: Cigarettes  . Smokeless tobacco: Never Used  . Alcohol use No     Allergies   Penicillins   Review of Systems Review of Systems  Constitutional: Negative for activity change, appetite change, chills, fatigue and  fever.  Skin: Positive for wound.  All other systems reviewed and are negative.    Physical Exam Triage Vital Signs ED Triage Vitals  Enc Vitals Group     BP 08/11/17 1533 127/81     Pulse Rate 08/11/17 1533 97     Resp 08/11/17 1533 16     Temp 08/11/17 1533 98.6 F (37 C)     Temp Source 08/11/17 1533 Oral     SpO2 08/11/17 1533 98 %     Weight 08/11/17 1536 200 lb (90.7 kg)     Height 08/11/17 1536 6' (1.829 m)     Head Circumference --      Peak Flow --      Pain Score 08/11/17 1536 0     Pain Loc --      Pain Edu? --      Excl. in [place]? --    No data found.   Updated Vital Signs BP 127/81 (BP Location: Left Arm)   Pulse 97   Temp 98.6 F (37 C) (Oral)   Resp 16   Ht 6' (1.829 m)   Wt 200 lb (90.7 kg)   SpO2 98%   BMI 27.12 kg/m   Visual Acuity Right Eye Distance:   Left Eye Distance:   Bilateral Distance:  Right Eye Near:   Left Eye Near:    Bilateral Near:     Physical Exam  Constitutional: He is oriented to person, place, and time. He appears well-developed and well-nourished. No distress.  HENT:  Head: Normocephalic.  Eyes: Pupils are equal, round, and reactive to light.  Neck: Normal range of motion.  Musculoskeletal: Normal range of motion.  Neurological: He is alert and oriented to person, place, and time.  Skin: Skin is warm and dry. He is not diaphoretic. There is erythema.  Examination of his right forearm over the radial aspect dorsum shows a small circular area 1 cm in diameter with erythema surrounding a dark punctate point in the middle. There is no induration there is no fluctuance. Is no drainage present.  Psychiatric: He has a normal mood and affect. His behavior is normal. Judgment and thought content normal.  Nursing note and vitals reviewed.    UC Treatments / Results  Labs (all labs ordered are listed, but only abnormal results are displayed) Labs Reviewed - No data to display  EKG  EKG Interpretation None        Radiology No results found.  Procedures Procedures (including critical care time)  Medications Ordered in UC Medications - No data to display   Initial Impression / Assessment and Plan / UC Course  I have reviewed the triage vital signs and the nursing notes.  Pertinent labs & imaging results that were available during my care of the patient were reviewed by me and considered in my medical decision making (see chart for details).     Plan: 1. Test/x-ray results and diagnosis reviewed with patient 2. rx as per orders; risks, benefits, potential side effects reviewed with patient 3. Recommend supportive treatment with serving for signs of infection. If any occur return to our clinic. He will wash area 3 times daily and apply Bactroban ointment cover with a dry dressing. He may follow-up with his primary care physician if it's not improving. 4. F/u prn if symptoms worsen or don't improve   Final Clinical Impressions(s) / UC Diagnoses   Final diagnoses:  Insect bite, initial encounter    New Prescriptions Discharge Medication List as of 08/11/2017  4:18 PM    START taking these medications   Details  mupirocin ointment (BACTROBAN) 2 % Apply 1 application topically 3 (three) times daily., Starting Tue 08/11/2017, Normal         Controlled Substance Prescriptions  Controlled Substance Registry consulted? Not Applicable   Lorin Picket, PA-C 08/11/17 1637

## 2017-08-11 NOTE — ED Triage Notes (Signed)
Patient noticed a bite on his right forearm today. Patient is concerned for spider bite. Patient denies pain or itching and did not feel anything bite him.

## 2017-08-17 ENCOUNTER — Other Ambulatory Visit
Admission: RE | Admit: 2017-08-17 | Discharge: 2017-08-17 | Disposition: A | Discharge status: Home / Self Care | Payer: Medicare Other | Source: Ambulatory Visit | Attending: Psychiatry | Admitting: Psychiatry

## 2017-08-17 DIAGNOSIS — F209 Schizophrenia, unspecified: Secondary | ICD-10-CM | POA: Insufficient documentation

## 2017-08-17 LAB — CBC WITH DIFFERENTIAL/PLATELET
BASOS PCT: 1 %
Basophils Absolute: 0.1 10*3/uL (ref 0–0.1)
Eosinophils Absolute: 0.1 10*3/uL (ref 0–0.7)
Eosinophils Relative: 2 %
HEMATOCRIT: 43.7 % (ref 40.0–52.0)
HEMOGLOBIN: 15 g/dL (ref 13.0–18.0)
LYMPHS ABS: 2 10*3/uL (ref 1.0–3.6)
Lymphocytes Relative: 22 %
MCH: 30.2 pg (ref 26.0–34.0)
MCHC: 34.3 g/dL (ref 32.0–36.0)
MCV: 88 fL (ref 80.0–100.0)
MONOS PCT: 7 %
Monocytes Absolute: 0.6 10*3/uL (ref 0.2–1.0)
NEUTROS ABS: 6.3 10*3/uL (ref 1.4–6.5)
Neutrophils Relative %: 68 %
Platelets: 170 10*3/uL (ref 150–440)
RBC: 4.97 MIL/uL (ref 4.40–5.90)
RDW: 13.6 % (ref 11.5–14.5)
WBC: 9.2 10*3/uL (ref 3.8–10.6)

## 2017-08-25 DIAGNOSIS — Z23 Encounter for immunization: Secondary | ICD-10-CM | POA: Diagnosis not present

## 2017-08-25 DIAGNOSIS — F209 Schizophrenia, unspecified: Secondary | ICD-10-CM | POA: Diagnosis not present

## 2017-08-25 DIAGNOSIS — Z79899 Other long term (current) drug therapy: Secondary | ICD-10-CM | POA: Diagnosis not present

## 2017-09-16 ENCOUNTER — Other Ambulatory Visit
Admission: RE | Admit: 2017-09-16 | Discharge: 2017-09-16 | Disposition: A | Discharge status: Home / Self Care | Payer: Medicare Other | Source: Ambulatory Visit | Attending: Obstetrics and Gynecology | Admitting: Obstetrics and Gynecology

## 2017-09-16 DIAGNOSIS — Z79899 Other long term (current) drug therapy: Secondary | ICD-10-CM | POA: Insufficient documentation

## 2017-09-16 LAB — CBC WITH DIFFERENTIAL/PLATELET
BASOS PCT: 1 %
Basophils Absolute: 0.1 10*3/uL (ref 0–0.1)
Eosinophils Absolute: 0.1 10*3/uL (ref 0–0.7)
Eosinophils Relative: 1 %
HEMATOCRIT: 44.8 % (ref 40.0–52.0)
Hemoglobin: 15 g/dL (ref 13.0–18.0)
Lymphocytes Relative: 16 %
Lymphs Abs: 1.8 10*3/uL (ref 1.0–3.6)
MCH: 29.7 pg (ref 26.0–34.0)
MCHC: 33.5 g/dL (ref 32.0–36.0)
MCV: 88.7 fL (ref 80.0–100.0)
MONO ABS: 0.7 10*3/uL (ref 0.2–1.0)
MONOS PCT: 7 %
NEUTROS ABS: 8.4 10*3/uL — AB (ref 1.4–6.5)
Neutrophils Relative %: 75 %
Platelets: 193 10*3/uL (ref 150–440)
RBC: 5.05 MIL/uL (ref 4.40–5.90)
RDW: 13.6 % (ref 11.5–14.5)
WBC: 11.2 10*3/uL — ABNORMAL HIGH (ref 3.8–10.6)

## 2017-09-22 DIAGNOSIS — F209 Schizophrenia, unspecified: Secondary | ICD-10-CM | POA: Diagnosis not present

## 2017-09-22 DIAGNOSIS — Z23 Encounter for immunization: Secondary | ICD-10-CM | POA: Diagnosis not present

## 2017-09-22 DIAGNOSIS — Z720 Tobacco use: Secondary | ICD-10-CM | POA: Diagnosis not present

## 2017-09-22 DIAGNOSIS — Z0000 Encounter for general adult medical examination without abnormal findings: Secondary | ICD-10-CM | POA: Diagnosis not present

## 2017-10-13 ENCOUNTER — Other Ambulatory Visit
Admission: RE | Admit: 2017-10-13 | Discharge: 2017-10-13 | Disposition: A | Discharge status: Home / Self Care | Payer: Medicare Other | Source: Ambulatory Visit | Attending: Psychiatry | Admitting: Psychiatry

## 2017-10-13 DIAGNOSIS — Z79899 Other long term (current) drug therapy: Secondary | ICD-10-CM | POA: Insufficient documentation

## 2017-10-13 LAB — CBC WITH DIFFERENTIAL/PLATELET
Basophils Absolute: 0.1 10*3/uL (ref 0–0.1)
Basophils Relative: 1 %
Eosinophils Absolute: 0.1 10*3/uL (ref 0–0.7)
Eosinophils Relative: 1 %
HEMATOCRIT: 42.6 % (ref 40.0–52.0)
HEMOGLOBIN: 14.2 g/dL (ref 13.0–18.0)
LYMPHS ABS: 1.9 10*3/uL (ref 1.0–3.6)
LYMPHS PCT: 19 %
MCH: 29.6 pg (ref 26.0–34.0)
MCHC: 33.4 g/dL (ref 32.0–36.0)
MCV: 88.6 fL (ref 80.0–100.0)
MONOS PCT: 6 %
Monocytes Absolute: 0.6 10*3/uL (ref 0.2–1.0)
NEUTROS ABS: 7.3 10*3/uL — AB (ref 1.4–6.5)
NEUTROS PCT: 73 %
Platelets: 196 10*3/uL (ref 150–440)
RBC: 4.81 MIL/uL (ref 4.40–5.90)
RDW: 12.9 % (ref 11.5–14.5)
WBC: 10 10*3/uL (ref 3.8–10.6)

## 2017-10-21 DIAGNOSIS — Z86018 Personal history of other benign neoplasm: Secondary | ICD-10-CM | POA: Diagnosis not present

## 2017-10-21 DIAGNOSIS — L570 Actinic keratosis: Secondary | ICD-10-CM | POA: Diagnosis not present

## 2017-10-21 DIAGNOSIS — D485 Neoplasm of uncertain behavior of skin: Secondary | ICD-10-CM | POA: Diagnosis not present

## 2017-11-05 DIAGNOSIS — D485 Neoplasm of uncertain behavior of skin: Secondary | ICD-10-CM | POA: Diagnosis not present

## 2017-11-11 ENCOUNTER — Other Ambulatory Visit
Admission: RE | Admit: 2017-11-11 | Discharge: 2017-11-11 | Disposition: A | Discharge status: Home / Self Care | Payer: Medicare Other | Source: Ambulatory Visit | Attending: Psychiatry | Admitting: Psychiatry

## 2017-11-11 DIAGNOSIS — F209 Schizophrenia, unspecified: Secondary | ICD-10-CM | POA: Insufficient documentation

## 2017-11-11 DIAGNOSIS — Z79899 Other long term (current) drug therapy: Secondary | ICD-10-CM | POA: Insufficient documentation

## 2017-11-11 LAB — CBC WITH DIFFERENTIAL/PLATELET
BASOS PCT: 1 %
Basophils Absolute: 0.1 10*3/uL (ref 0–0.1)
EOS PCT: 1 %
Eosinophils Absolute: 0.1 10*3/uL (ref 0–0.7)
HCT: 42.7 % (ref 40.0–52.0)
Hemoglobin: 14.4 g/dL (ref 13.0–18.0)
Lymphocytes Relative: 17 %
Lymphs Abs: 1.8 10*3/uL (ref 1.0–3.6)
MCH: 29.7 pg (ref 26.0–34.0)
MCHC: 33.6 g/dL (ref 32.0–36.0)
MCV: 88.3 fL (ref 80.0–100.0)
MONOS PCT: 6 %
Monocytes Absolute: 0.6 10*3/uL (ref 0.2–1.0)
Neutro Abs: 8.2 10*3/uL — ABNORMAL HIGH (ref 1.4–6.5)
Neutrophils Relative %: 75 %
PLATELETS: 194 10*3/uL (ref 150–440)
RBC: 4.84 MIL/uL (ref 4.40–5.90)
RDW: 13.2 % (ref 11.5–14.5)
WBC: 10.8 10*3/uL — AB (ref 3.8–10.6)

## 2017-11-11 LAB — LIPID PANEL
CHOLESTEROL: 179 mg/dL (ref 0–200)
HDL: 68 mg/dL (ref >40)
LDL Cholesterol: 77 mg/dL (ref 0–99)
TRIGLYCERIDES: 170 mg/dL — AB (ref <150)
Total CHOL/HDL Ratio: 2.6 RATIO
VLDL: 34 mg/dL (ref 0–40)

## 2017-11-12 LAB — HEMOGLOBIN A1C
Hgb A1c MFr Bld: 5.3 % (ref 4.8–5.6)
Mean Plasma Glucose: 105.41 mg/dL

## 2017-11-23 DIAGNOSIS — D229 Melanocytic nevi, unspecified: Secondary | ICD-10-CM | POA: Diagnosis not present

## 2017-11-23 DIAGNOSIS — D485 Neoplasm of uncertain behavior of skin: Secondary | ICD-10-CM | POA: Diagnosis not present

## 2017-11-24 DIAGNOSIS — F209 Schizophrenia, unspecified: Secondary | ICD-10-CM | POA: Diagnosis not present

## 2017-12-07 DIAGNOSIS — D229 Melanocytic nevi, unspecified: Secondary | ICD-10-CM | POA: Diagnosis not present

## 2017-12-07 DIAGNOSIS — R898 Other abnormal findings in specimens from other organs, systems and tissues: Secondary | ICD-10-CM | POA: Diagnosis not present

## 2017-12-07 DIAGNOSIS — D485 Neoplasm of uncertain behavior of skin: Secondary | ICD-10-CM | POA: Diagnosis not present

## 2017-12-08 ENCOUNTER — Other Ambulatory Visit
Admission: RE | Admit: 2017-12-08 | Discharge: 2017-12-08 | Disposition: A | Discharge status: Home / Self Care | Payer: Medicare Other | Source: Ambulatory Visit | Attending: Psychiatry | Admitting: Psychiatry

## 2017-12-08 DIAGNOSIS — Z79899 Other long term (current) drug therapy: Secondary | ICD-10-CM | POA: Insufficient documentation

## 2017-12-08 LAB — CBC WITH DIFFERENTIAL/PLATELET
BASOS PCT: 1 %
Basophils Absolute: 0 10*3/uL (ref 0–0.1)
Eosinophils Absolute: 0.1 10*3/uL (ref 0–0.7)
Eosinophils Relative: 1 %
HEMATOCRIT: 44 % (ref 40.0–52.0)
Hemoglobin: 14.9 g/dL (ref 13.0–18.0)
Lymphocytes Relative: 20 %
Lymphs Abs: 1.7 10*3/uL (ref 1.0–3.6)
MCH: 30.1 pg (ref 26.0–34.0)
MCHC: 33.7 g/dL (ref 32.0–36.0)
MCV: 89.3 fL (ref 80.0–100.0)
MONO ABS: 0.6 10*3/uL (ref 0.2–1.0)
MONOS PCT: 7 %
NEUTROS ABS: 5.8 10*3/uL (ref 1.4–6.5)
Neutrophils Relative %: 71 %
Platelets: 180 10*3/uL (ref 150–440)
RBC: 4.93 MIL/uL (ref 4.40–5.90)
RDW: 13.4 % (ref 11.5–14.5)
WBC: 8.2 10*3/uL (ref 3.8–10.6)

## 2018-01-06 ENCOUNTER — Other Ambulatory Visit
Admission: RE | Admit: 2018-01-06 | Discharge: 2018-01-06 | Disposition: A | Discharge status: Home / Self Care | Payer: Medicare Other | Source: Ambulatory Visit | Attending: Psychiatry | Admitting: Psychiatry

## 2018-01-06 DIAGNOSIS — Z79899 Other long term (current) drug therapy: Secondary | ICD-10-CM | POA: Diagnosis not present

## 2018-01-06 LAB — CBC WITH DIFFERENTIAL/PLATELET
Basophils Absolute: 0.1 10*3/uL (ref 0–0.1)
Basophils Relative: 1 %
Eosinophils Absolute: 0.1 10*3/uL (ref 0–0.7)
Eosinophils Relative: 1 %
HEMATOCRIT: 44.1 % (ref 40.0–52.0)
HEMOGLOBIN: 15.1 g/dL (ref 13.0–18.0)
LYMPHS ABS: 1.4 10*3/uL (ref 1.0–3.6)
LYMPHS PCT: 14 %
MCH: 30.5 pg (ref 26.0–34.0)
MCHC: 34.2 g/dL (ref 32.0–36.0)
MCV: 89.2 fL (ref 80.0–100.0)
MONO ABS: 0.9 10*3/uL (ref 0.2–1.0)
MONOS PCT: 10 %
NEUTROS ABS: 7.3 10*3/uL — AB (ref 1.4–6.5)
NEUTROS PCT: 74 %
Platelets: 187 10*3/uL (ref 150–440)
RBC: 4.94 MIL/uL (ref 4.40–5.90)
RDW: 13.4 % (ref 11.5–14.5)
WBC: 9.8 10*3/uL (ref 3.8–10.6)

## 2018-01-06 LAB — LIPID PANEL
CHOL/HDL RATIO: 2.7 ratio
CHOLESTEROL: 164 mg/dL (ref 0–200)
HDL: 61 mg/dL (ref >40)
LDL Cholesterol: 84 mg/dL (ref 0–99)
TRIGLYCERIDES: 95 mg/dL (ref <150)
VLDL: 19 mg/dL (ref 0–40)

## 2018-01-06 LAB — HEMOGLOBIN A1C
HEMOGLOBIN A1C: 5.2 % (ref 4.8–5.6)
Mean Plasma Glucose: 102.54 mg/dL

## 2018-02-08 ENCOUNTER — Other Ambulatory Visit
Admission: RE | Admit: 2018-02-08 | Discharge: 2018-02-08 | Disposition: A | Discharge status: Home / Self Care | Payer: Medicare Other | Source: Ambulatory Visit | Attending: Psychiatry | Admitting: Psychiatry

## 2018-02-08 DIAGNOSIS — Z5181 Encounter for therapeutic drug level monitoring: Secondary | ICD-10-CM | POA: Diagnosis not present

## 2018-02-08 DIAGNOSIS — Z79899 Other long term (current) drug therapy: Secondary | ICD-10-CM | POA: Insufficient documentation

## 2018-02-08 LAB — CBC WITH DIFFERENTIAL/PLATELET
BASOS PCT: 1 %
Basophils Absolute: 0.1 10*3/uL (ref 0–0.1)
Eosinophils Absolute: 0.1 10*3/uL (ref 0–0.7)
Eosinophils Relative: 2 %
HCT: 46.4 % (ref 40.0–52.0)
Hemoglobin: 15.6 g/dL (ref 13.0–18.0)
Lymphocytes Relative: 23 %
Lymphs Abs: 1.9 10*3/uL (ref 1.0–3.6)
MCH: 29.9 pg (ref 26.0–34.0)
MCHC: 33.7 g/dL (ref 32.0–36.0)
MCV: 88.8 fL (ref 80.0–100.0)
MONO ABS: 0.6 10*3/uL (ref 0.2–1.0)
MONOS PCT: 8 %
NEUTROS ABS: 5.5 10*3/uL (ref 1.4–6.5)
NEUTROS PCT: 66 %
Platelets: 192 10*3/uL (ref 150–440)
RBC: 5.22 MIL/uL (ref 4.40–5.90)
RDW: 12.9 % (ref 11.5–14.5)
WBC: 8.3 10*3/uL (ref 3.8–10.6)

## 2018-03-02 DIAGNOSIS — F209 Schizophrenia, unspecified: Secondary | ICD-10-CM | POA: Diagnosis not present

## 2018-03-02 DIAGNOSIS — F17210 Nicotine dependence, cigarettes, uncomplicated: Secondary | ICD-10-CM | POA: Diagnosis not present

## 2018-03-02 DIAGNOSIS — F205 Residual schizophrenia: Secondary | ICD-10-CM | POA: Diagnosis not present

## 2018-03-08 ENCOUNTER — Other Ambulatory Visit
Admission: RE | Admit: 2018-03-08 | Discharge: 2018-03-08 | Disposition: A | Discharge status: Home / Self Care | Payer: Medicare Other | Source: Ambulatory Visit | Attending: Psychiatry | Admitting: Psychiatry

## 2018-03-08 DIAGNOSIS — Z79899 Other long term (current) drug therapy: Secondary | ICD-10-CM | POA: Insufficient documentation

## 2018-03-08 LAB — CBC WITH DIFFERENTIAL/PLATELET
BASOS PCT: 1 %
Basophils Absolute: 0 10*3/uL (ref 0–0.1)
EOS ABS: 0.1 10*3/uL (ref 0–0.7)
Eosinophils Relative: 1 %
HCT: 44.9 % (ref 40.0–52.0)
HEMOGLOBIN: 15 g/dL (ref 13.0–18.0)
Lymphocytes Relative: 18 %
Lymphs Abs: 1.5 10*3/uL (ref 1.0–3.6)
MCH: 29.6 pg (ref 26.0–34.0)
MCHC: 33.4 g/dL (ref 32.0–36.0)
MCV: 88.5 fL (ref 80.0–100.0)
Monocytes Absolute: 0.5 10*3/uL (ref 0.2–1.0)
Monocytes Relative: 6 %
Neutro Abs: 6.3 10*3/uL (ref 1.4–6.5)
Neutrophils Relative %: 74 %
Platelets: 185 10*3/uL (ref 150–440)
RBC: 5.08 MIL/uL (ref 4.40–5.90)
RDW: 13.1 % (ref 11.5–14.5)
WBC: 8.4 10*3/uL (ref 3.8–10.6)

## 2018-04-07 ENCOUNTER — Other Ambulatory Visit
Admission: RE | Admit: 2018-04-07 | Discharge: 2018-04-07 | Disposition: A | Discharge status: Home / Self Care | Payer: Medicare Other | Source: Ambulatory Visit | Attending: Psychiatry | Admitting: Psychiatry

## 2018-04-07 DIAGNOSIS — Z79899 Other long term (current) drug therapy: Secondary | ICD-10-CM | POA: Insufficient documentation

## 2018-04-07 LAB — CBC WITH DIFFERENTIAL/PLATELET
Basophils Absolute: 0.1 10*3/uL (ref 0–0.1)
Basophils Relative: 1 %
Eosinophils Absolute: 0.1 10*3/uL (ref 0–0.7)
Eosinophils Relative: 1 %
HCT: 43.2 % (ref 40.0–52.0)
Hemoglobin: 14.6 g/dL (ref 13.0–18.0)
Lymphocytes Relative: 19 %
Lymphs Abs: 1.6 10*3/uL (ref 1.0–3.6)
MCH: 29.6 pg (ref 26.0–34.0)
MCHC: 33.9 g/dL (ref 32.0–36.0)
MCV: 87.4 fL (ref 80.0–100.0)
Monocytes Absolute: 0.7 10*3/uL (ref 0.2–1.0)
Monocytes Relative: 8 %
Neutro Abs: 6.2 10*3/uL (ref 1.4–6.5)
Neutrophils Relative %: 71 %
Platelets: 192 10*3/uL (ref 150–440)
RBC: 4.94 MIL/uL (ref 4.40–5.90)
RDW: 13.5 % (ref 11.5–14.5)
WBC: 8.7 10*3/uL (ref 3.8–10.6)

## 2018-05-07 ENCOUNTER — Other Ambulatory Visit
Admission: RE | Admit: 2018-05-07 | Discharge: 2018-05-07 | Disposition: A | Discharge status: Home / Self Care | Payer: Medicare Other | Source: Ambulatory Visit | Attending: Psychiatry | Admitting: Psychiatry

## 2018-05-07 DIAGNOSIS — Z79899 Other long term (current) drug therapy: Secondary | ICD-10-CM | POA: Insufficient documentation

## 2018-05-07 DIAGNOSIS — Z5181 Encounter for therapeutic drug level monitoring: Secondary | ICD-10-CM | POA: Insufficient documentation

## 2018-05-07 LAB — CBC WITH DIFFERENTIAL/PLATELET
BASOS ABS: 0.1 10*3/uL (ref 0–0.1)
BASOS PCT: 1 %
EOS ABS: 0.1 10*3/uL (ref 0–0.7)
EOS PCT: 2 %
HCT: 44.4 % (ref 40.0–52.0)
Hemoglobin: 15 g/dL (ref 13.0–18.0)
LYMPHS PCT: 22 %
Lymphs Abs: 2 10*3/uL (ref 1.0–3.6)
MCH: 29.5 pg (ref 26.0–34.0)
MCHC: 33.8 g/dL (ref 32.0–36.0)
MCV: 87.3 fL (ref 80.0–100.0)
MONO ABS: 0.7 10*3/uL (ref 0.2–1.0)
Monocytes Relative: 8 %
Neutro Abs: 6.2 10*3/uL (ref 1.4–6.5)
Neutrophils Relative %: 67 %
PLATELETS: 191 10*3/uL (ref 150–440)
RBC: 5.09 MIL/uL (ref 4.40–5.90)
RDW: 13.7 % (ref 11.5–14.5)
WBC: 9.1 10*3/uL (ref 3.8–10.6)

## 2018-06-01 DIAGNOSIS — F17210 Nicotine dependence, cigarettes, uncomplicated: Secondary | ICD-10-CM | POA: Diagnosis not present

## 2018-06-01 DIAGNOSIS — F203 Undifferentiated schizophrenia: Secondary | ICD-10-CM | POA: Diagnosis not present

## 2018-06-01 DIAGNOSIS — Z79899 Other long term (current) drug therapy: Secondary | ICD-10-CM | POA: Diagnosis not present

## 2018-06-07 DIAGNOSIS — L578 Other skin changes due to chronic exposure to nonionizing radiation: Secondary | ICD-10-CM | POA: Diagnosis not present

## 2018-06-07 DIAGNOSIS — D485 Neoplasm of uncertain behavior of skin: Secondary | ICD-10-CM | POA: Diagnosis not present

## 2018-06-07 DIAGNOSIS — B888 Other specified infestations: Secondary | ICD-10-CM | POA: Diagnosis not present

## 2018-06-07 DIAGNOSIS — Z86018 Personal history of other benign neoplasm: Secondary | ICD-10-CM | POA: Diagnosis not present

## 2018-06-07 DIAGNOSIS — Z872 Personal history of diseases of the skin and subcutaneous tissue: Secondary | ICD-10-CM | POA: Diagnosis not present

## 2018-06-07 DIAGNOSIS — Z1283 Encounter for screening for malignant neoplasm of skin: Secondary | ICD-10-CM | POA: Diagnosis not present

## 2018-07-05 DIAGNOSIS — D2272 Melanocytic nevi of left lower limb, including hip: Secondary | ICD-10-CM | POA: Diagnosis not present

## 2018-07-05 DIAGNOSIS — D485 Neoplasm of uncertain behavior of skin: Secondary | ICD-10-CM | POA: Diagnosis not present

## 2018-07-08 ENCOUNTER — Other Ambulatory Visit
Admission: RE | Admit: 2018-07-08 | Discharge: 2018-07-08 | Disposition: A | Discharge status: Home / Self Care | Payer: Medicare Other | Source: Ambulatory Visit | Attending: Internal Medicine | Admitting: Internal Medicine

## 2018-07-08 DIAGNOSIS — Z79899 Other long term (current) drug therapy: Secondary | ICD-10-CM | POA: Insufficient documentation

## 2018-07-08 DIAGNOSIS — Z5181 Encounter for therapeutic drug level monitoring: Secondary | ICD-10-CM | POA: Insufficient documentation

## 2018-07-08 DIAGNOSIS — F29 Unspecified psychosis not due to a substance or known physiological condition: Secondary | ICD-10-CM | POA: Diagnosis not present

## 2018-07-08 LAB — CBC WITH DIFFERENTIAL/PLATELET
BASOS ABS: 0 10*3/uL (ref 0–0.1)
BASOS PCT: 1 %
EOS ABS: 0.1 10*3/uL (ref 0–0.7)
EOS PCT: 2 %
HCT: 44.9 % (ref 40.0–52.0)
Hemoglobin: 14.9 g/dL (ref 13.0–18.0)
Lymphocytes Relative: 23 %
Lymphs Abs: 1.9 10*3/uL (ref 1.0–3.6)
MCH: 29.6 pg (ref 26.0–34.0)
MCHC: 33.2 g/dL (ref 32.0–36.0)
MCV: 89.1 fL (ref 80.0–100.0)
MONO ABS: 0.5 10*3/uL (ref 0.2–1.0)
MONOS PCT: 7 %
Neutro Abs: 5.4 10*3/uL (ref 1.4–6.5)
Neutrophils Relative %: 67 %
Platelets: 192 10*3/uL (ref 150–440)
RBC: 5.04 MIL/uL (ref 4.40–5.90)
RDW: 13.4 % (ref 11.5–14.5)
WBC: 8 10*3/uL (ref 3.8–10.6)

## 2018-07-19 DIAGNOSIS — D485 Neoplasm of uncertain behavior of skin: Secondary | ICD-10-CM | POA: Diagnosis not present

## 2018-07-19 DIAGNOSIS — L905 Scar conditions and fibrosis of skin: Secondary | ICD-10-CM | POA: Diagnosis not present

## 2018-07-19 DIAGNOSIS — D225 Melanocytic nevi of trunk: Secondary | ICD-10-CM | POA: Diagnosis not present

## 2018-07-26 DIAGNOSIS — D225 Melanocytic nevi of trunk: Secondary | ICD-10-CM | POA: Diagnosis not present

## 2018-08-02 DIAGNOSIS — L905 Scar conditions and fibrosis of skin: Secondary | ICD-10-CM | POA: Diagnosis not present

## 2018-08-02 DIAGNOSIS — Z23 Encounter for immunization: Secondary | ICD-10-CM | POA: Diagnosis not present

## 2018-08-02 DIAGNOSIS — D225 Melanocytic nevi of trunk: Secondary | ICD-10-CM | POA: Diagnosis not present

## 2018-08-09 DIAGNOSIS — D229 Melanocytic nevi, unspecified: Secondary | ICD-10-CM | POA: Diagnosis not present

## 2018-08-11 ENCOUNTER — Other Ambulatory Visit
Admission: RE | Admit: 2018-08-11 | Discharge: 2018-08-11 | Disposition: A | Discharge status: Home / Self Care | Payer: Medicare Other | Source: Ambulatory Visit | Attending: Internal Medicine | Admitting: Internal Medicine

## 2018-08-11 DIAGNOSIS — Z79899 Other long term (current) drug therapy: Secondary | ICD-10-CM | POA: Diagnosis not present

## 2018-08-11 LAB — CBC WITH DIFFERENTIAL/PLATELET
BASOS ABS: 0.1 10*3/uL (ref 0–0.1)
BASOS PCT: 1 %
EOS PCT: 1 %
Eosinophils Absolute: 0.1 10*3/uL (ref 0–0.7)
HCT: 43 % (ref 40.0–52.0)
Hemoglobin: 14.5 g/dL (ref 13.0–18.0)
LYMPHS PCT: 23 %
Lymphs Abs: 1.7 10*3/uL (ref 1.0–3.6)
MCH: 30.1 pg (ref 26.0–34.0)
MCHC: 33.7 g/dL (ref 32.0–36.0)
MCV: 89.3 fL (ref 80.0–100.0)
Monocytes Absolute: 0.6 10*3/uL (ref 0.2–1.0)
Monocytes Relative: 7 %
Neutro Abs: 5.1 10*3/uL (ref 1.4–6.5)
Neutrophils Relative %: 68 %
PLATELETS: 190 10*3/uL (ref 150–440)
RBC: 4.81 MIL/uL (ref 4.40–5.90)
RDW: 13.4 % (ref 11.5–14.5)
WBC: 7.5 10*3/uL (ref 3.8–10.6)

## 2018-08-19 DIAGNOSIS — Z86018 Personal history of other benign neoplasm: Secondary | ICD-10-CM | POA: Diagnosis not present

## 2018-08-24 DIAGNOSIS — F209 Schizophrenia, unspecified: Secondary | ICD-10-CM | POA: Diagnosis not present

## 2018-09-08 ENCOUNTER — Other Ambulatory Visit
Admission: RE | Admit: 2018-09-08 | Discharge: 2018-09-08 | Disposition: A | Discharge status: Home / Self Care | Payer: Medicare Other | Source: Ambulatory Visit | Attending: Internal Medicine | Admitting: Internal Medicine

## 2018-09-08 DIAGNOSIS — Z79899 Other long term (current) drug therapy: Secondary | ICD-10-CM | POA: Diagnosis not present

## 2018-09-08 LAB — CBC WITH DIFFERENTIAL/PLATELET
Abs Immature Granulocytes: 0.02 10*3/uL (ref 0.00–0.07)
BASOS ABS: 0.1 10*3/uL (ref 0.0–0.1)
Basophils Relative: 1 %
EOS ABS: 0.1 10*3/uL (ref 0.0–0.5)
Eosinophils Relative: 2 %
HEMATOCRIT: 43.6 % (ref 39.0–52.0)
Hemoglobin: 14.5 g/dL (ref 13.0–17.0)
IMMATURE GRANULOCYTES: 0 %
LYMPHS ABS: 1.9 10*3/uL (ref 0.7–4.0)
LYMPHS PCT: 26 %
MCH: 29.9 pg (ref 26.0–34.0)
MCHC: 33.3 g/dL (ref 30.0–36.0)
MCV: 89.9 fL (ref 80.0–100.0)
Monocytes Absolute: 0.5 10*3/uL (ref 0.1–1.0)
Monocytes Relative: 7 %
NEUTROS PCT: 64 %
NRBC: 0 % (ref 0.0–0.2)
Neutro Abs: 4.7 10*3/uL (ref 1.7–7.7)
Platelets: 189 10*3/uL (ref 150–400)
RBC: 4.85 MIL/uL (ref 4.22–5.81)
RDW: 12.7 % (ref 11.5–15.5)
WBC: 7.3 10*3/uL (ref 4.0–10.5)

## 2018-09-27 DIAGNOSIS — Z720 Tobacco use: Secondary | ICD-10-CM | POA: Diagnosis not present

## 2018-09-27 DIAGNOSIS — F17210 Nicotine dependence, cigarettes, uncomplicated: Secondary | ICD-10-CM | POA: Diagnosis not present

## 2018-09-27 DIAGNOSIS — Z114 Encounter for screening for human immunodeficiency virus [HIV]: Secondary | ICD-10-CM | POA: Diagnosis not present

## 2018-09-27 DIAGNOSIS — Z125 Encounter for screening for malignant neoplasm of prostate: Secondary | ICD-10-CM | POA: Diagnosis not present

## 2018-09-27 DIAGNOSIS — F2089 Other schizophrenia: Secondary | ICD-10-CM | POA: Diagnosis not present

## 2018-09-27 DIAGNOSIS — Z0000 Encounter for general adult medical examination without abnormal findings: Secondary | ICD-10-CM | POA: Diagnosis not present

## 2018-09-27 DIAGNOSIS — Z79899 Other long term (current) drug therapy: Secondary | ICD-10-CM | POA: Diagnosis not present

## 2018-09-27 DIAGNOSIS — Z87891 Personal history of nicotine dependence: Secondary | ICD-10-CM | POA: Diagnosis not present

## 2018-09-27 DIAGNOSIS — F209 Schizophrenia, unspecified: Secondary | ICD-10-CM | POA: Diagnosis not present

## 2018-09-27 DIAGNOSIS — K219 Gastro-esophageal reflux disease without esophagitis: Secondary | ICD-10-CM | POA: Diagnosis not present

## 2018-09-27 DIAGNOSIS — Z131 Encounter for screening for diabetes mellitus: Secondary | ICD-10-CM | POA: Diagnosis not present

## 2018-09-27 DIAGNOSIS — Z113 Encounter for screening for infections with a predominantly sexual mode of transmission: Secondary | ICD-10-CM | POA: Diagnosis not present

## 2018-10-04 ENCOUNTER — Other Ambulatory Visit
Admission: RE | Admit: 2018-10-04 | Discharge: 2018-10-04 | Disposition: A | Discharge status: Home / Self Care | Payer: Medicare Other | Source: Ambulatory Visit | Attending: Internal Medicine | Admitting: Internal Medicine

## 2018-10-04 DIAGNOSIS — Z79899 Other long term (current) drug therapy: Secondary | ICD-10-CM | POA: Insufficient documentation

## 2018-10-04 LAB — CBC WITH DIFFERENTIAL/PLATELET
Abs Immature Granulocytes: 0.03 10*3/uL (ref 0.00–0.07)
BASOS PCT: 1 %
Basophils Absolute: 0 10*3/uL (ref 0.0–0.1)
EOS PCT: 2 %
Eosinophils Absolute: 0.1 10*3/uL (ref 0.0–0.5)
HEMATOCRIT: 43.7 % (ref 39.0–52.0)
Hemoglobin: 14.2 g/dL (ref 13.0–17.0)
Immature Granulocytes: 0 %
LYMPHS PCT: 26 %
Lymphs Abs: 2 10*3/uL (ref 0.7–4.0)
MCH: 29.2 pg (ref 26.0–34.0)
MCHC: 32.5 g/dL (ref 30.0–36.0)
MCV: 89.9 fL (ref 80.0–100.0)
MONO ABS: 0.7 10*3/uL (ref 0.1–1.0)
MONOS PCT: 9 %
NEUTROS ABS: 4.8 10*3/uL (ref 1.7–7.7)
Neutrophils Relative %: 62 %
PLATELETS: 198 10*3/uL (ref 150–400)
RBC: 4.86 MIL/uL (ref 4.22–5.81)
RDW: 12.9 % (ref 11.5–15.5)
WBC: 7.7 10*3/uL (ref 4.0–10.5)
nRBC: 0 % (ref 0.0–0.2)

## 2018-11-12 ENCOUNTER — Other Ambulatory Visit
Admission: RE | Admit: 2018-11-12 | Discharge: 2018-11-12 | Disposition: A | Discharge status: Home / Self Care | Payer: Medicare Other | Attending: Internal Medicine | Admitting: Internal Medicine

## 2018-11-12 DIAGNOSIS — Z79899 Other long term (current) drug therapy: Secondary | ICD-10-CM | POA: Insufficient documentation

## 2018-11-12 LAB — CBC WITH DIFFERENTIAL/PLATELET
Abs Immature Granulocytes: 0.02 10*3/uL (ref 0.00–0.07)
BASOS ABS: 0.1 10*3/uL (ref 0.0–0.1)
Basophils Relative: 1 %
EOS PCT: 1 %
Eosinophils Absolute: 0.1 10*3/uL (ref 0.0–0.5)
HCT: 43.8 % (ref 39.0–52.0)
HEMOGLOBIN: 14.2 g/dL (ref 13.0–17.0)
IMMATURE GRANULOCYTES: 0 %
LYMPHS PCT: 23 %
Lymphs Abs: 2.1 10*3/uL (ref 0.7–4.0)
MCH: 29.3 pg (ref 26.0–34.0)
MCHC: 32.4 g/dL (ref 30.0–36.0)
MCV: 90.3 fL (ref 80.0–100.0)
Monocytes Absolute: 0.7 10*3/uL (ref 0.1–1.0)
Monocytes Relative: 7 %
NEUTROS PCT: 68 %
Neutro Abs: 6.1 10*3/uL (ref 1.7–7.7)
Platelets: 234 10*3/uL (ref 150–400)
RBC: 4.85 MIL/uL (ref 4.22–5.81)
RDW: 12.7 % (ref 11.5–15.5)
WBC: 9.1 10*3/uL (ref 4.0–10.5)
nRBC: 0 % (ref 0.0–0.2)

## 2018-11-23 DIAGNOSIS — F209 Schizophrenia, unspecified: Secondary | ICD-10-CM | POA: Diagnosis not present

## 2018-12-08 DIAGNOSIS — D485 Neoplasm of uncertain behavior of skin: Secondary | ICD-10-CM | POA: Diagnosis not present

## 2018-12-08 DIAGNOSIS — L578 Other skin changes due to chronic exposure to nonionizing radiation: Secondary | ICD-10-CM | POA: Diagnosis not present

## 2018-12-08 DIAGNOSIS — D225 Melanocytic nevi of trunk: Secondary | ICD-10-CM | POA: Diagnosis not present

## 2018-12-08 DIAGNOSIS — Z86018 Personal history of other benign neoplasm: Secondary | ICD-10-CM | POA: Diagnosis not present

## 2018-12-09 ENCOUNTER — Other Ambulatory Visit
Admission: RE | Admit: 2018-12-09 | Discharge: 2018-12-09 | Disposition: A | Discharge status: Home / Self Care | Payer: Medicare Other | Attending: Internal Medicine | Admitting: Internal Medicine

## 2018-12-09 DIAGNOSIS — Z79899 Other long term (current) drug therapy: Secondary | ICD-10-CM | POA: Diagnosis not present

## 2018-12-09 LAB — CBC WITH DIFFERENTIAL/PLATELET
Abs Immature Granulocytes: 0.02 10*3/uL (ref 0.00–0.07)
Basophils Absolute: 0.1 10*3/uL (ref 0.0–0.1)
Basophils Relative: 1 %
Eosinophils Absolute: 0.1 10*3/uL (ref 0.0–0.5)
Eosinophils Relative: 1 %
HEMATOCRIT: 42.8 % (ref 39.0–52.0)
HEMOGLOBIN: 14.4 g/dL (ref 13.0–17.0)
IMMATURE GRANULOCYTES: 0 %
LYMPHS ABS: 1.6 10*3/uL (ref 0.7–4.0)
Lymphocytes Relative: 20 %
MCH: 30.5 pg (ref 26.0–34.0)
MCHC: 33.6 g/dL (ref 30.0–36.0)
MCV: 90.7 fL (ref 80.0–100.0)
MONO ABS: 0.6 10*3/uL (ref 0.1–1.0)
MONOS PCT: 7 %
NEUTROS ABS: 5.8 10*3/uL (ref 1.7–7.7)
NEUTROS PCT: 71 %
Platelets: 206 10*3/uL (ref 150–400)
RBC: 4.72 MIL/uL (ref 4.22–5.81)
RDW: 12.5 % (ref 11.5–15.5)
WBC: 8.2 10*3/uL (ref 4.0–10.5)
nRBC: 0 % (ref 0.0–0.2)

## 2019-01-04 DIAGNOSIS — F209 Schizophrenia, unspecified: Secondary | ICD-10-CM | POA: Diagnosis not present

## 2019-01-10 ENCOUNTER — Other Ambulatory Visit
Admission: RE | Admit: 2019-01-10 | Discharge: 2019-01-10 | Disposition: A | Discharge status: Home / Self Care | Payer: Medicare Other | Attending: Internal Medicine | Admitting: Internal Medicine

## 2019-01-10 DIAGNOSIS — Z79899 Other long term (current) drug therapy: Secondary | ICD-10-CM | POA: Diagnosis not present

## 2019-01-10 LAB — CBC WITH DIFFERENTIAL/PLATELET
Abs Immature Granulocytes: 0.03 10*3/uL (ref 0.00–0.07)
BASOS PCT: 1 %
Basophils Absolute: 0.1 10*3/uL (ref 0.0–0.1)
EOS ABS: 0.2 10*3/uL (ref 0.0–0.5)
EOS PCT: 2 %
HEMATOCRIT: 43.5 % (ref 39.0–52.0)
Hemoglobin: 14.3 g/dL (ref 13.0–17.0)
IMMATURE GRANULOCYTES: 0 %
LYMPHS ABS: 1.7 10*3/uL (ref 0.7–4.0)
Lymphocytes Relative: 20 %
MCH: 30.1 pg (ref 26.0–34.0)
MCHC: 32.9 g/dL (ref 30.0–36.0)
MCV: 91.6 fL (ref 80.0–100.0)
MONOS PCT: 7 %
Monocytes Absolute: 0.6 10*3/uL (ref 0.1–1.0)
Neutro Abs: 6 10*3/uL (ref 1.7–7.7)
Neutrophils Relative %: 70 %
PLATELETS: 254 10*3/uL (ref 150–400)
RBC: 4.75 MIL/uL (ref 4.22–5.81)
RDW: 12.4 % (ref 11.5–15.5)
WBC: 8.5 10*3/uL (ref 4.0–10.5)
nRBC: 0 % (ref 0.0–0.2)

## 2019-02-07 ENCOUNTER — Other Ambulatory Visit
Admission: RE | Admit: 2019-02-07 | Discharge: 2019-02-07 | Disposition: A | Discharge status: Home / Self Care | Payer: Medicare Other | Attending: Internal Medicine | Admitting: Internal Medicine

## 2019-02-07 ENCOUNTER — Other Ambulatory Visit: Payer: Self-pay

## 2019-02-07 DIAGNOSIS — Z79899 Other long term (current) drug therapy: Secondary | ICD-10-CM | POA: Diagnosis not present

## 2019-02-07 LAB — CBC WITH DIFFERENTIAL/PLATELET
Abs Immature Granulocytes: 0.03 10*3/uL (ref 0.00–0.07)
BASOS ABS: 0.1 10*3/uL (ref 0.0–0.1)
BASOS PCT: 1 %
EOS ABS: 0.2 10*3/uL (ref 0.0–0.5)
Eosinophils Relative: 2 %
HEMATOCRIT: 42.8 % (ref 39.0–52.0)
Hemoglobin: 14.1 g/dL (ref 13.0–17.0)
IMMATURE GRANULOCYTES: 0 %
LYMPHS ABS: 1.8 10*3/uL (ref 0.7–4.0)
Lymphocytes Relative: 22 %
MCH: 29.7 pg (ref 26.0–34.0)
MCHC: 32.9 g/dL (ref 30.0–36.0)
MCV: 90.1 fL (ref 80.0–100.0)
Monocytes Absolute: 0.6 10*3/uL (ref 0.1–1.0)
Monocytes Relative: 7 %
NEUTROS PCT: 68 %
NRBC: 0 % (ref 0.0–0.2)
Neutro Abs: 5.7 10*3/uL (ref 1.7–7.7)
Platelets: 232 10*3/uL (ref 150–400)
RBC: 4.75 MIL/uL (ref 4.22–5.81)
RDW: 12.5 % (ref 11.5–15.5)
WBC: 8.3 10*3/uL (ref 4.0–10.5)

## 2019-02-17 DIAGNOSIS — L249 Irritant contact dermatitis, unspecified cause: Secondary | ICD-10-CM | POA: Diagnosis not present

## 2019-07-12 DIAGNOSIS — Z79899 Other long term (current) drug therapy: Secondary | ICD-10-CM | POA: Diagnosis not present

## 2019-07-12 DIAGNOSIS — F17200 Nicotine dependence, unspecified, uncomplicated: Secondary | ICD-10-CM | POA: Diagnosis not present

## 2019-07-12 DIAGNOSIS — F205 Residual schizophrenia: Secondary | ICD-10-CM | POA: Diagnosis not present

## 2019-07-27 DIAGNOSIS — Z23 Encounter for immunization: Secondary | ICD-10-CM | POA: Diagnosis not present

## 2019-08-18 DIAGNOSIS — Z79899 Other long term (current) drug therapy: Secondary | ICD-10-CM | POA: Diagnosis not present

## 2019-09-20 DIAGNOSIS — Z79899 Other long term (current) drug therapy: Secondary | ICD-10-CM | POA: Diagnosis not present

## 2019-10-03 DIAGNOSIS — Z0000 Encounter for general adult medical examination without abnormal findings: Secondary | ICD-10-CM | POA: Diagnosis not present

## 2019-10-03 DIAGNOSIS — Z131 Encounter for screening for diabetes mellitus: Secondary | ICD-10-CM | POA: Diagnosis not present

## 2019-10-03 DIAGNOSIS — Z13220 Encounter for screening for lipoid disorders: Secondary | ICD-10-CM | POA: Diagnosis not present

## 2019-10-03 DIAGNOSIS — F2089 Other schizophrenia: Secondary | ICD-10-CM | POA: Diagnosis not present

## 2019-10-03 DIAGNOSIS — Z1159 Encounter for screening for other viral diseases: Secondary | ICD-10-CM | POA: Diagnosis not present

## 2019-10-03 DIAGNOSIS — Z6827 Body mass index (BMI) 27.0-27.9, adult: Secondary | ICD-10-CM | POA: Diagnosis not present

## 2019-10-03 DIAGNOSIS — K219 Gastro-esophageal reflux disease without esophagitis: Secondary | ICD-10-CM | POA: Diagnosis not present

## 2019-10-03 DIAGNOSIS — F209 Schizophrenia, unspecified: Secondary | ICD-10-CM | POA: Diagnosis not present

## 2019-10-03 DIAGNOSIS — F17210 Nicotine dependence, cigarettes, uncomplicated: Secondary | ICD-10-CM | POA: Diagnosis not present

## 2019-10-03 DIAGNOSIS — Z79899 Other long term (current) drug therapy: Secondary | ICD-10-CM | POA: Diagnosis not present

## 2019-10-04 DIAGNOSIS — F17200 Nicotine dependence, unspecified, uncomplicated: Secondary | ICD-10-CM | POA: Diagnosis not present

## 2019-10-04 DIAGNOSIS — F209 Schizophrenia, unspecified: Secondary | ICD-10-CM | POA: Diagnosis not present

## 2019-10-10 ENCOUNTER — Other Ambulatory Visit: Payer: Self-pay

## 2019-10-10 ENCOUNTER — Ambulatory Visit: Payer: Medicare Other

## 2019-10-10 ENCOUNTER — Ambulatory Visit
Admission: EM | Admit: 2019-10-10 | Discharge: 2019-10-10 | Disposition: A | Discharge status: Home / Self Care | Payer: Medicare Other | Attending: Family Medicine | Admitting: Family Medicine

## 2019-10-10 DIAGNOSIS — W228XXA Striking against or struck by other objects, initial encounter: Secondary | ICD-10-CM | POA: Diagnosis not present

## 2019-10-10 DIAGNOSIS — S9032XA Contusion of left foot, initial encounter: Secondary | ICD-10-CM | POA: Insufficient documentation

## 2019-10-10 NOTE — ED Provider Notes (Signed)
[facility]    CSN: [id number] Arrival date & time: 10/10/19  1558  History   Chief Complaint Chief Complaint  Patient presents with  . Foot Pain   HPI   43 year old male presents with foot pain.  Patient reports that he kicked his bed last night as he was angry.  He reports that he developed bruising and pain of his left great toe and second toe.  His pain is mild.  He states that he is able to ambulate.  He is concerned as the bruising seems to be worsening.  No medications tried.  No other interventions tried.  No reports of decreased range of motion.  He is concerned primarily about the bruising.  No other reported symptoms.  No other complaints.  PMH, Surgical Hx, Family Hx, Social History reviewed and updated as below.  Past Medical History:  Diagnosis Date  . GERD (gastroesophageal reflux disease)   . Schizophrenia ([place])    Home Medications    Prior to Admission medications   Medication Sig Start Date End Date Taking? Authorizing Provider  cloZAPine (CLOZARIL) 100 MG tablet Take 600 mg by mouth daily.   Yes [provider]  omeprazole (PRILOSEC) 40 MG capsule Take 40 mg by mouth at bedtime.   Yes [provider]  traZODone (DESYREL) 50 MG tablet Take 50 mg by mouth at bedtime.   Yes [provider]  mupirocin ointment (BACTROBAN) 2 % Apply 1 application topically 3 (three) times daily. 08/11/17   Lorin Picket, PA-C    Family History Family History  Problem Relation Age of Onset  . COPD Mother     Social History Social History   Tobacco Use  . Smoking status: Current Every Day Smoker    Packs/day: 1.00    Types: Cigarettes  . Smokeless tobacco: Never Used  Substance Use Topics  . Alcohol use: No  . Drug use: No     Allergies   Penicillins   Review of Systems Review of Systems  Constitutional: Negative.   Musculoskeletal:       Left foot injury.   Physical Exam Triage Vital Signs ED Triage Vitals  [10/10/19 1617]  Enc Vitals Group     BP (!) 134/96     Pulse Rate 95     Resp 18     Temp 98.4 F (36.9 C)     Temp Source Oral     SpO2 100 %     Weight 203 lb (92.1 kg)     Height 6' (1.829 m)     Head Circumference      Peak Flow      Pain Score 0     Pain Loc      Pain Edu?      Excl. in [place]?    Updated Vital Signs BP (!) 134/96 (BP Location: Left Arm)   Pulse 95   Temp 98.4 F (36.9 C) (Oral)   Resp 18   Ht 6' (1.829 m)   Wt 92.1 kg   SpO2 100%   BMI 27.53 kg/m   Visual Acuity Right Eye Distance:   Left Eye Distance:   Bilateral Distance:    Right Eye Near:   Left Eye Near:    Bilateral Near:     Physical Exam Vitals signs and nursing note reviewed.  Constitutional:      General: He is not in acute distress.    Appearance: Normal appearance. He is not ill-appearing.  HENT:  Head: Normocephalic and atraumatic.  Eyes:     General:        Right eye: No discharge.        Left eye: No discharge.     Conjunctiva/sclera: Conjunctivae normal.  Cardiovascular:     Rate and Rhythm: Normal rate and regular rhythm.     Heart sounds: No murmur.  Pulmonary:     Effort: Pulmonary effort is normal. No respiratory distress.     Breath sounds: Normal breath sounds.  Musculoskeletal:     Comments: Left foot -patient with bruising over the dorsal aspect of the great toe and second toe.  Minimally tender to palpation.  Normal range of motion.  Skin:    Comments: Bruising noted of the left great toe and second toe.  Neurological:     Mental Status: He is alert.  Psychiatric:        Behavior: Behavior normal.     Comments: Flat affect.     UC Treatments / Results  Labs (all labs ordered are listed, but only abnormal results are displayed) Labs Reviewed - No data to display  EKG   Radiology Dg Foot Complete Left  Result Date: 10/10/2019 CLINICAL DATA:  Pain EXAM: LEFT FOOT - COMPLETE 3+ VIEW COMPARISON:  None. FINDINGS: There is no evidence of fracture  or dislocation. There is no evidence of arthropathy or other focal bone abnormality. Soft tissues are unremarkable. IMPRESSION: Negative. Electronically Signed   By: Constance Holster M.D.   On: 10/10/2019 16:54    Procedures Procedures (including critical care time)  Medications Ordered in UC Medications - No data to display  Initial Impression / Assessment and Plan / UC Course  I have reviewed the triage vital signs and the nursing notes.  Pertinent labs & imaging results that were available during my care of the patient were reviewed by me and considered in my medical decision making (see chart for details).    43 year old male presents with a contusion of his left foot.  X-rays negative.  Ibuprofen as needed.  Supportive care.  Final Clinical Impressions(s) / UC Diagnoses   Final diagnoses:  Contusion of left foot, initial encounter     Discharge Instructions     Ibuprofen as needed.  Take care  Dr. Lacinda Axon    ED Prescriptions    None     PDMP not reviewed this encounter.   Coral Spikes, [place] 10/10/19 2127

## 2019-10-10 NOTE — ED Triage Notes (Signed)
Left foot injury with left great toe pain after kicking boxspring last night. Reports pain with walking and bruising today. Able to ambulate. Bruising to left great toe and second digit currently.

## 2019-10-10 NOTE — Discharge Instructions (Signed)
Ibuprofen as needed.  Take care  Dr. Lacinda Axon

## 2020-11-28 ENCOUNTER — Other Ambulatory Visit
Admission: RE | Admit: 2020-11-28 | Discharge: 2020-11-28 | Disposition: A | Discharge status: Home / Self Care | Payer: Medicare Other | Attending: Psychiatry | Admitting: Psychiatry

## 2020-11-28 DIAGNOSIS — Z79899 Other long term (current) drug therapy: Secondary | ICD-10-CM | POA: Insufficient documentation

## 2020-11-28 LAB — CBC WITH DIFFERENTIAL/PLATELET
Abs Immature Granulocytes: 0.02 10*3/uL (ref 0.00–0.07)
Basophils Absolute: 0.1 10*3/uL (ref 0.0–0.1)
Basophils Relative: 1 %
Eosinophils Absolute: 0.1 10*3/uL (ref 0.0–0.5)
Eosinophils Relative: 1 %
HCT: 44.7 % (ref 39.0–52.0)
Hemoglobin: 14.7 g/dL (ref 13.0–17.0)
Immature Granulocytes: 0 %
Lymphocytes Relative: 26 %
Lymphs Abs: 2 10*3/uL (ref 0.7–4.0)
MCH: 29 pg (ref 26.0–34.0)
MCHC: 32.9 g/dL (ref 30.0–36.0)
MCV: 88.2 fL (ref 80.0–100.0)
Monocytes Absolute: 0.7 10*3/uL (ref 0.1–1.0)
Monocytes Relative: 9 %
Neutro Abs: 5 10*3/uL (ref 1.7–7.7)
Neutrophils Relative %: 63 %
Platelets: 235 10*3/uL (ref 150–400)
RBC: 5.07 MIL/uL (ref 4.22–5.81)
RDW: 12.7 % (ref 11.5–15.5)
WBC: 7.9 10*3/uL (ref 4.0–10.5)
nRBC: 0 % (ref 0.0–0.2)

## 2020-12-26 ENCOUNTER — Other Ambulatory Visit: Payer: Self-pay

## 2020-12-26 ENCOUNTER — Other Ambulatory Visit
Admission: RE | Admit: 2020-12-26 | Discharge: 2020-12-26 | Disposition: A | Discharge status: Home / Self Care | Payer: Medicare Other | Attending: Psychiatry | Admitting: Psychiatry

## 2020-12-26 DIAGNOSIS — Z79899 Other long term (current) drug therapy: Secondary | ICD-10-CM | POA: Diagnosis present

## 2020-12-26 LAB — CBC WITH DIFFERENTIAL/PLATELET
Abs Immature Granulocytes: 0.03 10*3/uL (ref 0.00–0.07)
Basophils Absolute: 0.1 10*3/uL (ref 0.0–0.1)
Basophils Relative: 1 %
Eosinophils Absolute: 0.1 10*3/uL (ref 0.0–0.5)
Eosinophils Relative: 1 %
HCT: 42.3 % (ref 39.0–52.0)
Hemoglobin: 13.9 g/dL (ref 13.0–17.0)
Immature Granulocytes: 0 %
Lymphocytes Relative: 25 %
Lymphs Abs: 1.9 10*3/uL (ref 0.7–4.0)
MCH: 29 pg (ref 26.0–34.0)
MCHC: 32.9 g/dL (ref 30.0–36.0)
MCV: 88.1 fL (ref 80.0–100.0)
Monocytes Absolute: 0.6 10*3/uL (ref 0.1–1.0)
Monocytes Relative: 8 %
Neutro Abs: 5 10*3/uL (ref 1.7–7.7)
Neutrophils Relative %: 65 %
Platelets: 218 10*3/uL (ref 150–400)
RBC: 4.8 MIL/uL (ref 4.22–5.81)
RDW: 12.7 % (ref 11.5–15.5)
WBC: 7.7 10*3/uL (ref 4.0–10.5)
nRBC: 0 % (ref 0.0–0.2)

## 2021-01-18 ENCOUNTER — Other Ambulatory Visit
Admission: RE | Admit: 2021-01-18 | Discharge: 2021-01-18 | Disposition: A | Discharge status: Home / Self Care | Payer: Medicare Other | Attending: Psychiatry | Admitting: Psychiatry

## 2021-01-18 DIAGNOSIS — Z79899 Other long term (current) drug therapy: Secondary | ICD-10-CM | POA: Diagnosis present

## 2021-01-18 LAB — CBC WITH DIFFERENTIAL/PLATELET
Abs Immature Granulocytes: 0.01 10*3/uL (ref 0.00–0.07)
Basophils Absolute: 0.1 10*3/uL (ref 0.0–0.1)
Basophils Relative: 1 %
Eosinophils Absolute: 0.1 10*3/uL (ref 0.0–0.5)
Eosinophils Relative: 1 %
HCT: 44 % (ref 39.0–52.0)
Hemoglobin: 14.6 g/dL (ref 13.0–17.0)
Immature Granulocytes: 0 %
Lymphocytes Relative: 25 %
Lymphs Abs: 1.8 10*3/uL (ref 0.7–4.0)
MCH: 29.2 pg (ref 26.0–34.0)
MCHC: 33.2 g/dL (ref 30.0–36.0)
MCV: 88 fL (ref 80.0–100.0)
Monocytes Absolute: 0.6 10*3/uL (ref 0.1–1.0)
Monocytes Relative: 9 %
Neutro Abs: 4.6 10*3/uL (ref 1.7–7.7)
Neutrophils Relative %: 64 %
Platelets: 217 10*3/uL (ref 150–400)
RBC: 5 MIL/uL (ref 4.22–5.81)
RDW: 12.7 % (ref 11.5–15.5)
WBC: 7.1 10*3/uL (ref 4.0–10.5)
nRBC: 0 % (ref 0.0–0.2)

## 2021-02-25 ENCOUNTER — Other Ambulatory Visit
Admission: RE | Admit: 2021-02-25 | Discharge: 2021-02-25 | Disposition: A | Discharge status: Home / Self Care | Payer: Medicare Other | Attending: Psychiatry | Admitting: Psychiatry

## 2021-02-25 ENCOUNTER — Other Ambulatory Visit: Payer: Self-pay

## 2021-02-25 DIAGNOSIS — Z79899 Other long term (current) drug therapy: Secondary | ICD-10-CM | POA: Diagnosis not present

## 2021-02-25 DIAGNOSIS — Z5181 Encounter for therapeutic drug level monitoring: Secondary | ICD-10-CM | POA: Diagnosis present

## 2021-02-25 LAB — CBC WITH DIFFERENTIAL/PLATELET
Abs Immature Granulocytes: 0.03 10*3/uL (ref 0.00–0.07)
Basophils Absolute: 0 10*3/uL (ref 0.0–0.1)
Basophils Relative: 1 %
Eosinophils Absolute: 0.1 10*3/uL (ref 0.0–0.5)
Eosinophils Relative: 2 %
HCT: 43 % (ref 39.0–52.0)
Hemoglobin: 14.1 g/dL (ref 13.0–17.0)
Immature Granulocytes: 0 %
Lymphocytes Relative: 28 %
Lymphs Abs: 2.1 10*3/uL (ref 0.7–4.0)
MCH: 29.3 pg (ref 26.0–34.0)
MCHC: 32.8 g/dL (ref 30.0–36.0)
MCV: 89.2 fL (ref 80.0–100.0)
Monocytes Absolute: 0.6 10*3/uL (ref 0.1–1.0)
Monocytes Relative: 8 %
Neutro Abs: 4.5 10*3/uL (ref 1.7–7.7)
Neutrophils Relative %: 61 %
Platelets: 202 10*3/uL (ref 150–400)
RBC: 4.82 MIL/uL (ref 4.22–5.81)
RDW: 12.8 % (ref 11.5–15.5)
WBC: 7.3 10*3/uL (ref 4.0–10.5)
nRBC: 0 % (ref 0.0–0.2)

## 2021-03-26 ENCOUNTER — Other Ambulatory Visit
Admission: RE | Admit: 2021-03-26 | Discharge: 2021-03-26 | Disposition: A | Discharge status: Home / Self Care | Payer: Medicare Other | Attending: Psychiatry | Admitting: Psychiatry

## 2021-03-26 DIAGNOSIS — Z79899 Other long term (current) drug therapy: Secondary | ICD-10-CM | POA: Diagnosis present

## 2021-03-26 LAB — CBC WITH DIFFERENTIAL/PLATELET
Abs Immature Granulocytes: 0.01 10*3/uL (ref 0.00–0.07)
Basophils Absolute: 0.1 10*3/uL (ref 0.0–0.1)
Basophils Relative: 1 %
Eosinophils Absolute: 0.1 10*3/uL (ref 0.0–0.5)
Eosinophils Relative: 1 %
HCT: 42.8 % (ref 39.0–52.0)
Hemoglobin: 13.9 g/dL (ref 13.0–17.0)
Immature Granulocytes: 0 %
Lymphocytes Relative: 31 %
Lymphs Abs: 2.2 10*3/uL (ref 0.7–4.0)
MCH: 28.5 pg (ref 26.0–34.0)
MCHC: 32.5 g/dL (ref 30.0–36.0)
MCV: 87.7 fL (ref 80.0–100.0)
Monocytes Absolute: 0.6 10*3/uL (ref 0.1–1.0)
Monocytes Relative: 9 %
Neutro Abs: 4.1 10*3/uL (ref 1.7–7.7)
Neutrophils Relative %: 58 %
Platelets: 269 10*3/uL (ref 150–400)
RBC: 4.88 MIL/uL (ref 4.22–5.81)
RDW: 12.6 % (ref 11.5–15.5)
WBC: 7.1 10*3/uL (ref 4.0–10.5)
nRBC: 0 % (ref 0.0–0.2)

## 2021-05-01 ENCOUNTER — Other Ambulatory Visit
Admission: RE | Admit: 2021-05-01 | Discharge: 2021-05-01 | Disposition: A | Discharge status: Home / Self Care | Payer: Medicare Other | Attending: Psychiatry | Admitting: Psychiatry

## 2021-05-01 ENCOUNTER — Other Ambulatory Visit: Payer: Self-pay

## 2021-05-01 DIAGNOSIS — Z79899 Other long term (current) drug therapy: Secondary | ICD-10-CM | POA: Insufficient documentation

## 2021-05-01 LAB — CBC WITH DIFFERENTIAL/PLATELET
Abs Immature Granulocytes: 0.04 10*3/uL (ref 0.00–0.07)
Basophils Absolute: 0 10*3/uL (ref 0.0–0.1)
Basophils Relative: 0 %
Eosinophils Absolute: 0.1 10*3/uL (ref 0.0–0.5)
Eosinophils Relative: 1 %
HCT: 41 % (ref 39.0–52.0)
Hemoglobin: 13.7 g/dL (ref 13.0–17.0)
Immature Granulocytes: 0 %
Lymphocytes Relative: 16 %
Lymphs Abs: 1.5 10*3/uL (ref 0.7–4.0)
MCH: 29.1 pg (ref 26.0–34.0)
MCHC: 33.4 g/dL (ref 30.0–36.0)
MCV: 87.2 fL (ref 80.0–100.0)
Monocytes Absolute: 1.2 10*3/uL — ABNORMAL HIGH (ref 0.1–1.0)
Monocytes Relative: 13 %
Neutro Abs: 6.5 10*3/uL (ref 1.7–7.7)
Neutrophils Relative %: 70 %
Platelets: 186 10*3/uL (ref 150–400)
RBC: 4.7 MIL/uL (ref 4.22–5.81)
RDW: 13 % (ref 11.5–15.5)
WBC: 9.4 10*3/uL (ref 4.0–10.5)
nRBC: 0 % (ref 0.0–0.2)

## 2021-05-24 ENCOUNTER — Other Ambulatory Visit
Admission: RE | Admit: 2021-05-24 | Discharge: 2021-05-24 | Disposition: A | Discharge status: Home / Self Care | Payer: Medicare Other | Attending: Psychiatry | Admitting: Psychiatry

## 2021-05-24 DIAGNOSIS — Z79899 Other long term (current) drug therapy: Secondary | ICD-10-CM | POA: Diagnosis present

## 2021-05-24 LAB — CBC WITH DIFFERENTIAL/PLATELET
Abs Immature Granulocytes: 0.02 10*3/uL (ref 0.00–0.07)
Basophils Absolute: 0.1 10*3/uL (ref 0.0–0.1)
Basophils Relative: 1 %
Eosinophils Absolute: 0.1 10*3/uL (ref 0.0–0.5)
Eosinophils Relative: 1 %
HCT: 42.2 % (ref 39.0–52.0)
Hemoglobin: 14.2 g/dL (ref 13.0–17.0)
Immature Granulocytes: 0 %
Lymphocytes Relative: 24 %
Lymphs Abs: 2.2 10*3/uL (ref 0.7–4.0)
MCH: 29.3 pg (ref 26.0–34.0)
MCHC: 33.6 g/dL (ref 30.0–36.0)
MCV: 87 fL (ref 80.0–100.0)
Monocytes Absolute: 0.8 10*3/uL (ref 0.1–1.0)
Monocytes Relative: 9 %
Neutro Abs: 6.2 10*3/uL (ref 1.7–7.7)
Neutrophils Relative %: 65 %
Platelets: 241 10*3/uL (ref 150–400)
RBC: 4.85 MIL/uL (ref 4.22–5.81)
RDW: 13 % (ref 11.5–15.5)
WBC: 9.4 10*3/uL (ref 4.0–10.5)
nRBC: 0 % (ref 0.0–0.2)

## 2021-06-26 ENCOUNTER — Other Ambulatory Visit
Admission: RE | Admit: 2021-06-26 | Discharge: 2021-06-26 | Disposition: A | Discharge status: Home / Self Care | Payer: Medicare Other | Attending: Psychiatry | Admitting: Psychiatry

## 2021-06-26 DIAGNOSIS — Z79899 Other long term (current) drug therapy: Secondary | ICD-10-CM | POA: Insufficient documentation

## 2021-06-26 LAB — CBC WITH DIFFERENTIAL/PLATELET
Abs Immature Granulocytes: 0.01 10*3/uL (ref 0.00–0.07)
Basophils Absolute: 0 10*3/uL (ref 0.0–0.1)
Basophils Relative: 1 %
Eosinophils Absolute: 0.1 10*3/uL (ref 0.0–0.5)
Eosinophils Relative: 1 %
HCT: 40.8 % (ref 39.0–52.0)
Hemoglobin: 13.5 g/dL (ref 13.0–17.0)
Immature Granulocytes: 0 %
Lymphocytes Relative: 31 %
Lymphs Abs: 1.3 10*3/uL (ref 0.7–4.0)
MCH: 28.8 pg (ref 26.0–34.0)
MCHC: 33.1 g/dL (ref 30.0–36.0)
MCV: 87.2 fL (ref 80.0–100.0)
Monocytes Absolute: 0.5 10*3/uL (ref 0.1–1.0)
Monocytes Relative: 12 %
Neutro Abs: 2.5 10*3/uL (ref 1.7–7.7)
Neutrophils Relative %: 55 %
Platelets: 171 10*3/uL (ref 150–400)
RBC: 4.68 MIL/uL (ref 4.22–5.81)
RDW: 13.1 % (ref 11.5–15.5)
WBC: 4.4 10*3/uL (ref 4.0–10.5)
nRBC: 0 % (ref 0.0–0.2)
